# Patient Record
Sex: Male | Born: 1965 | Race: Black or African American | Hispanic: No | Marital: Married | Smoking: Never smoker
Health system: Southern US, Community
[De-identification: ages and names within clinical notes are randomized; demographics above are authoritative.]

## PROBLEM LIST (undated history)

## (undated) DIAGNOSIS — M1990 Unspecified osteoarthritis, unspecified site: Secondary | ICD-10-CM

## (undated) HISTORY — PX: WRIST SURGERY: SHX841

---

## 2000-05-27 ENCOUNTER — Emergency Department (HOSPITAL_COMMUNITY): Admission: EM | Admit: 2000-05-27 | Discharge: 2000-05-27 | Payer: Self-pay | Admitting: Emergency Medicine

## 2000-05-27 ENCOUNTER — Encounter: Payer: Self-pay | Admitting: Emergency Medicine

## 2004-07-06 ENCOUNTER — Emergency Department (HOSPITAL_COMMUNITY): Admission: EM | Admit: 2004-07-06 | Discharge: 2004-07-06 | Payer: Self-pay | Admitting: Emergency Medicine

## 2004-07-29 ENCOUNTER — Emergency Department (HOSPITAL_COMMUNITY): Admission: EM | Admit: 2004-07-29 | Discharge: 2004-07-29 | Payer: Self-pay | Admitting: Family Medicine

## 2005-05-16 ENCOUNTER — Emergency Department (HOSPITAL_COMMUNITY): Admission: EM | Admit: 2005-05-16 | Discharge: 2005-05-17 | Payer: Self-pay | Admitting: Emergency Medicine

## 2006-01-28 ENCOUNTER — Emergency Department (HOSPITAL_COMMUNITY): Admission: EM | Admit: 2006-01-28 | Discharge: 2006-01-28 | Payer: Self-pay | Admitting: Emergency Medicine

## 2007-04-19 ENCOUNTER — Emergency Department (HOSPITAL_COMMUNITY): Admission: EM | Admit: 2007-04-19 | Discharge: 2007-04-19 | Payer: Self-pay | Admitting: Emergency Medicine

## 2010-08-12 ENCOUNTER — Emergency Department (HOSPITAL_COMMUNITY)
Admission: EM | Admit: 2010-08-12 | Discharge: 2010-08-12 | Disposition: A | Discharge status: Home / Self Care | Payer: Self-pay | Attending: Emergency Medicine | Admitting: Emergency Medicine

## 2010-08-12 DIAGNOSIS — M25569 Pain in unspecified knee: Secondary | ICD-10-CM | POA: Insufficient documentation

## 2010-09-29 ENCOUNTER — Emergency Department (HOSPITAL_COMMUNITY)
Admission: EM | Admit: 2010-09-29 | Discharge: 2010-09-29 | Disposition: A | Discharge status: Home / Self Care | Payer: No Typology Code available for payment source | Attending: Emergency Medicine | Admitting: Emergency Medicine

## 2010-09-29 ENCOUNTER — Emergency Department (HOSPITAL_COMMUNITY): Payer: No Typology Code available for payment source

## 2010-09-29 DIAGNOSIS — M25569 Pain in unspecified knee: Secondary | ICD-10-CM | POA: Insufficient documentation

## 2010-09-29 DIAGNOSIS — M546 Pain in thoracic spine: Secondary | ICD-10-CM | POA: Insufficient documentation

## 2010-09-29 DIAGNOSIS — M545 Low back pain, unspecified: Secondary | ICD-10-CM | POA: Insufficient documentation

## 2012-07-23 ENCOUNTER — Encounter (HOSPITAL_COMMUNITY): Payer: Self-pay | Admitting: Emergency Medicine

## 2012-07-23 ENCOUNTER — Emergency Department (HOSPITAL_COMMUNITY): Payer: Self-pay

## 2012-07-23 ENCOUNTER — Emergency Department (HOSPITAL_COMMUNITY)
Admission: EM | Admit: 2012-07-23 | Discharge: 2012-07-23 | Disposition: A | Discharge status: Home / Self Care | Payer: Self-pay | Attending: Emergency Medicine | Admitting: Emergency Medicine

## 2012-07-23 DIAGNOSIS — M1710 Unilateral primary osteoarthritis, unspecified knee: Secondary | ICD-10-CM | POA: Insufficient documentation

## 2012-07-23 DIAGNOSIS — M1990 Unspecified osteoarthritis, unspecified site: Secondary | ICD-10-CM

## 2012-07-23 DIAGNOSIS — G8929 Other chronic pain: Secondary | ICD-10-CM | POA: Insufficient documentation

## 2012-07-23 DIAGNOSIS — IMO0002 Reserved for concepts with insufficient information to code with codable children: Secondary | ICD-10-CM | POA: Insufficient documentation

## 2012-07-23 MED ORDER — MELOXICAM 15 MG PO TABS: 15.0000 mg | ORAL_TABLET | Freq: Every day | ORAL | Status: DC

## 2012-07-23 MED ORDER — OXYCODONE-ACETAMINOPHEN 5-325 MG PO TABS: ORAL_TABLET | ORAL | Status: DC

## 2012-07-23 NOTE — ED Provider Notes (Signed)
Medical screening examination/treatment/procedure(s) were performed by non-physician practitioner and as supervising physician I was immediately available for consultation/collaboration.  Ethelda Chick, MD 07/23/12 [phone]

## 2012-07-23 NOTE — ED Notes (Signed)
Pt from home c/o bilateral knee pain. Pt reports that he has been having knee pain for approx 2 mths. Pt denies injury bur states " they are swollen". Pt A&O and in NAD

## 2012-07-23 NOTE — ED Provider Notes (Signed)
History    CSN: [id number] Arrival date & time 07/23/12  1448  First MD Initiated Contact with Patient 07/23/12 1511     Chief Complaint  Patient presents with  . Knee Pain   (Consider location/radiation/quality/duration/timing/severity/associated sxs/prior Treatment) HPI Comments: Patient with knee pain BL. Onset several months ago. Morning stiffness. He says his knees burn, click and pop. He has no history of injuries but played basketball for many years. Associated heat and swelling. Denies fevers, chills, myalgias, . Denies DOE, SOB, chest tightness or pressure, radiation to left arm, jaw or back, or diaphoresis. Denies dysuria, flank pain, suprapubic pain, frequency, urgency, or hematuria. Denies headaches, light headedness, weakness, visual disturbances. Denies abdominal pain, nausea, vomiting, diarrhea or constipation.    Patient is a 47 y.o. male presenting with knee pain. The history is provided by the patient. No language interpreter was used.  Knee Pain Location:  Knee Time since incident: several months. Injury: no   Knee location:  L knee and R knee Pain details:    Quality:  Aching and burning   Radiates to:  Does not radiate   Pain severity now: moderate to severe.   Onset quality:  Gradual   Timing:  Constant Chronicity:  Chronic Dislocation: no   Relieved by:  None tried Worsened by:  Activity and bearing weight Associated symptoms: decreased ROM   Associated symptoms: no back pain, no fatigue, no fever, no itching, no muscle weakness, no neck pain, no numbness, no stiffness, no swelling and no tingling   Risk factors: obesity   Risk factors: no concern for non-accidental trauma, no frequent fractures, no known bone disorder and no recent illness      History reviewed. No pertinent past medical history. Past Surgical History  Procedure Laterality Date  . Wrist surgery     No family history on file. History  Substance Use Topics  . Smoking status:  Never Smoker   . Smokeless tobacco: Not on file  . Alcohol Use: No    Review of Systems  Constitutional: Negative for fever and fatigue.  HENT: Negative for neck pain.   Eyes: Negative for pain.  Respiratory: Negative for shortness of breath.   Cardiovascular: Negative for chest pain and palpitations.  Genitourinary: Negative for discharge, penile swelling and penile pain.  Musculoskeletal: Positive for arthralgias and gait problem. Negative for back pain and stiffness.  Skin: Negative for itching, rash and wound.    Allergies  Review of patient's allergies indicates no known allergies.  Home Medications  No current outpatient prescriptions on file. BP 118/70  Pulse 74  Temp(Src) 98.3 F (36.8 C) (Oral)  Resp 16  SpO2 99% Physical Exam Physical Exam  Nursing note and vitals reviewed. Constitutional: He appears well-developed and well-nourished. No distress.  HENT:  Head: Normocephalic and atraumatic.  Eyes: Conjunctivae normal are normal. No scleral icterus.  Neck: Normal range of motion. Neck supple.  Cardiovascular: Normal rate, regular rhythm and normal heart sounds.   Pulmonary/Chest: Effort normal and breath sounds normal. No respiratory distress.  Abdominal: Soft. There is no tenderness.  Musculoskeletal: He exhibits no edema. BL crepitus of the knees with movement. Warm to the touch. No heat redness or swelling. Good stability in the joint. N/V intact Neurological: He is alert.  Skin: Skin is warm and dry. He is not diaphoretic.  Psychiatric: His behavior is normal.    ED Course  Procedures (including critical care time) Labs Reviewed - No data to display Dg Knee Complete  4 Views Left  07/23/2012   *RADIOLOGY REPORT*  Clinical Data:  knee pain  LEFT KNEE - COMPLETE 4+ VIEW  Comparison: None.  Findings: There is no joint effusion.  No fracture or subluxation identified.  Sharpening tibial spines and marginal spur formation is noted consistent with osteoarthritis.   IMPRESSION:  1.  No acute findings. 2.  Osteoarthritis.   Original Report Authenticated By: Signa Kell, M.D.   Dg Knee Complete 4 Views Right  07/23/2012   *RADIOLOGY REPORT*  Clinical Data: Knee pain  RIGHT KNEE - COMPLETE 4+ VIEW  Comparison: None  Findings: No joint effusion.  There is no fracture or subluxation identified.  There is no radiopaque foreign body or soft tissue calcification.  Sharpening tibial spines and marginal spur formation is noted consistent with osteoarthritis.  IMPRESSION:  1.  Degenerative joint disease. 2.  No acute findings.   Original Report Authenticated By: Signa Kell, M.D.   1. Osteoarthritis (arthritis due to wear and tear of joints)   2. Degenerative joint disease     MDM  4:00 PM Patient's xrays shows OA of the knees BL.  Will D/C with Mobic  And percocet for severe pain. Do not suspect autoimmune process.     Arthor Captain, PA-C 07/23/12 1601

## 2013-07-30 ENCOUNTER — Emergency Department (HOSPITAL_COMMUNITY): Payer: Self-pay

## 2013-07-30 ENCOUNTER — Emergency Department (HOSPITAL_COMMUNITY)
Admission: EM | Admit: 2013-07-30 | Discharge: 2013-07-30 | Disposition: A | Discharge status: Home / Self Care | Payer: Self-pay | Attending: Emergency Medicine | Admitting: Emergency Medicine

## 2013-07-30 ENCOUNTER — Encounter (HOSPITAL_COMMUNITY): Payer: Self-pay | Admitting: Emergency Medicine

## 2013-07-30 DIAGNOSIS — S8990XA Unspecified injury of unspecified lower leg, initial encounter: Secondary | ICD-10-CM | POA: Insufficient documentation

## 2013-07-30 DIAGNOSIS — X500XXA Overexertion from strenuous movement or load, initial encounter: Secondary | ICD-10-CM | POA: Insufficient documentation

## 2013-07-30 DIAGNOSIS — S99929A Unspecified injury of unspecified foot, initial encounter: Principal | ICD-10-CM

## 2013-07-30 DIAGNOSIS — Y9301 Activity, walking, marching and hiking: Secondary | ICD-10-CM | POA: Insufficient documentation

## 2013-07-30 DIAGNOSIS — Y9289 Other specified places as the place of occurrence of the external cause: Secondary | ICD-10-CM | POA: Insufficient documentation

## 2013-07-30 DIAGNOSIS — Z791 Long term (current) use of non-steroidal anti-inflammatories (NSAID): Secondary | ICD-10-CM | POA: Insufficient documentation

## 2013-07-30 DIAGNOSIS — S99919A Unspecified injury of unspecified ankle, initial encounter: Principal | ICD-10-CM

## 2013-07-30 DIAGNOSIS — M25561 Pain in right knee: Secondary | ICD-10-CM

## 2013-07-30 MED ORDER — HYDROCODONE-ACETAMINOPHEN 5-325 MG PO TABS: 1.0000 | ORAL_TABLET | Freq: Four times a day (QID) | ORAL | Status: DC | PRN

## 2013-07-30 MED ORDER — NAPROXEN 500 MG PO TABS: 500.0000 mg | ORAL_TABLET | Freq: Two times a day (BID) | ORAL | Status: DC

## 2013-07-30 NOTE — Discharge Instructions (Signed)
Be sure to read and understand instructions below prior to leaving the hospital. If your symptoms persist without any improvement in 1 week it is recommended that you follow up with orthopedics listed above. Use your pain medication as prescribed and do not operate heavy machinery while on pain medication. Note that your pain medication contains acetaminophen (Tylenol) & its is not reccommended that you use additional acetaminophen (Tylenol) while taking this medication.  Knee Effusion  The medical term for having fluid in your knee is effusion.This means something is wrong inside the knee. Some of the causes of fluid in the knee may be torn cartilage, a torn ligament, or bleeding into the joint from an injury. Small tears may heal on their own with conservative treatment. Conservative means rest, limited weight bearing activity and muscle strengthening exercises. Your recovery may take up to 6 weeks. Larger tears may require surgery.   TREATMENT  Rest, ice, elevation, and compression are the basic modes of treatment.   Apply ice to the sore area for 15 to 20 minutes, 3 to 4 times per day. Do this while you are awake for the first 2 days, or as directed. This can be stopped when the swelling goes away. Put the ice in a plastic bag and place a towel between the bag of ice and your skin.  Keep your leg elevated when possible to lessen swelling.  If your caregiver recommends crutches, use them as instructed for 1 week. Then, you may walk as tolerated.  Do not drive a vehicle on pain medication. ACTIVITY:            - Weight bearing as tolerated            - Exercises should be limited to pain free range of motion  Knee Immobilization:: This is used to support and protect an injured or painful knee. Knee immobilizers keep your knee from being used while it is healing.  Use powder to control irritation from sweat and friction.  Adjust the immobilizer to be firm but not tight. Signs of an immobilizer that  is too tight include:   Swelling.   Numbness.   Color change in your foot or ankle.   Increased pain.  While resting, raise your leg above the level of your heart. This reduces throbbing and helps healing. Prop it up with pillows.  Remove the immobilizer to bathe and sleep. Wear it other times until you see your doctor again.               SEEK MEDICAL CARE IF:  You have an increase in bruising, swelling, or pain.  Your toes feel cold.  Pain relief is not achieved with medications.  EMERGENCY:: Your toes are numb or blue or you have severe pain.  You notice redness, swelling, warmth or increasing pain in your knee.  An unexplained oral temperature above 102 F (38.9 C) develops.  COLD THERAPY DIRECTIONS:  Ice or gel packs can be used to reduce both pain and swelling. Ice is the most helpful within the first 24 to 48 hours after an injury or flareup from overusing a muscle or joint.  Ice is effective, has very few side effects, and is safe for most people to use.   If you expose your skin to cold temperatures for too long or without the proper protection, you can damage your skin or nerves. Watch for signs of skin damage due to cold.   HOME CARE INSTRUCTIONS  Follow these  tips to use ice and cold packs safely.  °Place a dry or damp towel between the ice and skin. A damp towel will cool the skin more quickly, so you may need to shorten the time that the ice is used.  °For a more rapid response, add gentle compression to the ice.  °Ice for no more than 10 to 20 minutes at a time. The bonier the area you are icing, the less time it will take to get the benefits of ice.  °Check your skin after 5 minutes to make sure there are no signs of a poor response to cold or skin damage.  °Rest 20 minutes or more in between uses.  °Once your skin is numb, you can end your treatment. You can test numbness by very lightly touching your skin. The touch should be so light that you do not see the skin dimple from  the pressure of your fingertip. When using ice, most people will feel these normal sensations in this order: cold, burning, aching, and numbness.  °Do not use ice on someone who cannot communicate their responses to pain, such as small children or people with dementia.  ° °HOW TO MAKE AN ICE PACK  °To make an ice pack, do one of the following:  °Place crushed ice or a bag of frozen vegetables in a sealable plastic bag. Squeeze out the excess air. Place this bag inside another plastic bag. Slide the bag into a pillowcase or place a damp towel between your skin and the bag.  °Mix 3 parts water with 1 part rubbing alcohol. Freeze the mixture in a sealable plastic bag. When you remove the mixture from the freezer, it will be slushy. Squeeze out the excess air. Place this bag inside another plastic bag. Slide the bag into a pillowcase or place a damp towel between your s ° ° ° ° ° °

## 2013-07-30 NOTE — ED Provider Notes (Signed)
CSN: [id number]     Arrival date & time 07/30/13  1521 History   None   This chart was scribed for non-physician practitioner working with Gilda Crease, *, by Andrew Au, ED Scribe. This patient was seen in room WTR5/WTR5 and the patient's care was started at 4:59 PM. No chief complaint on file.  The history is provided by the patient. No language interpreter was used.   Nicholas Howell is a 48 y.o. male who presents to the Emergency Department complaining of shooting right knee pain x 1 day. Pt states he was walking and stepped in a hole causing his knee to give out. He reports he heard a "pop" in right knee. He reports unchanged instability and pain with walking today. Pt states he has taken Advil without relief to pain. He reports associated swelling of the knee, which has improved. Pt denies numbness and tingling.  No prior knee injury.    History reviewed. No pertinent past medical history. Past Surgical History  Procedure Laterality Date  . Wrist surgery     No family history on file. History  Substance Use Topics  . Smoking status: Never Smoker   . Smokeless tobacco: Not on file  . Alcohol Use: No    Review of Systems  Constitutional: Negative for fever and chills.  Musculoskeletal: Positive for arthralgias, gait problem and myalgias.  Neurological: Negative for weakness and numbness.    Allergies  Review of patient's allergies indicates no known allergies.  Home Medications   Prior to Admission medications   Medication Sig Start Date End Date Taking? Authorizing Provider  meloxicam (MOBIC) 15 MG tablet Take 1 tablet (15 mg total) by mouth daily. Take 1 daily with food. 07/23/12   Arthor Captain, PA-C  oxyCODONE-acetaminophen (PERCOCET) 5-325 MG per tablet 1 tablet every 6 hours as needed for your worst pain. 07/23/12   Abigail Harris, PA-C   BP 116/69  Pulse 82  Temp(Src) 98.4 F (36.9 C) (Oral)  Resp 18  SpO2 97% Physical Exam  Nursing note and vitals  reviewed. Constitutional: He is oriented to person, place, and time. He appears well-developed and well-nourished. No distress.  HENT:  Head: Normocephalic and atraumatic.  Eyes: Conjunctivae and EOM are normal.  Neck: Neck supple.  Cardiovascular: Normal rate, regular rhythm and normal heart sounds.   Pulses:      Dorsalis pedis pulses are 2+ on the right side, and 2+ on the left side.  Pulmonary/Chest: Effort normal and breath sounds normal.  Musculoskeletal: Normal range of motion.       Right knee: He exhibits normal range of motion, no swelling and no erythema. Tenderness found. Medial joint line tenderness noted.  Right knee-  No obvious erythema, edema or warmth of the right knee. Pain with valgus stress of the right knee but no obvious laxity with valgus stress and varus stress of the right knee.  Neurological: He is alert and oriented to person, place, and time.  Distal sensation of right toes intact  Skin: Skin is warm and dry.  Psychiatric: He has a normal mood and affect. His behavior is normal.    ED Course  Procedures  DIAGNOSTIC STUDIES: Oxygen Saturation is 97% on RA, normal by my interpretation.    COORDINATION OF CARE: 5:13 PM- Pt advised of plan for treatment which includes knee immobilizer and pt agrees.  Labs Review Labs Reviewed - No data to display  Imaging Review Dg Knee Complete 4 Views Right  07/30/2013  CLINICAL DATA:  Trauma.  EXAM: RIGHT KNEE - COMPLETE 4+ VIEW  COMPARISON:  07/23/2012.  FINDINGS: Prominent tricompartment degenerative change noted. Degenerative changes are particularly severe at the patellofemoral joint space. No evidence of fracture or dislocation. No evidence of effusion .Similar findings noted on prior study.  IMPRESSION: Severe degenerative changes right knee, particularly prominent about the patellofemoral joint space. No acute abnormality. Similar findings noted on prior study .   Electronically Signed   By: Maisie Fushomas  Register   On:  07/30/2013 16:11     EKG Interpretation None      MDM   Final diagnoses:  None  Patient presenting with knee injury.  No acute findings on xray.  No signs of infection of the knee.  Neurovascularly intact.  Patient declined knee immobilizer so he was given knee sleeve.  Patient given short course of pain medication and referral to Orthopedics.    I personally performed the services described in this documentation, which was scribed in my presence. The recorded information has been reviewed and is accurate.      Santiago GladHeather Katelynd Blauvelt, PA-C 08/01/13 1035

## 2013-07-30 NOTE — ED Notes (Signed)
Pt reports right knee "gave out" on him Sunday morning and today he had difficulty ambulating pain 9/10 with swelling.

## 2013-08-03 NOTE — ED Provider Notes (Signed)
Medical screening examination/treatment/procedure(s) were performed by non-physician practitioner and as supervising physician I was immediately available for consultation/collaboration.   EKG Interpretation None        Christopher J. Pollina, MD 08/03/13 1501 

## 2013-11-01 ENCOUNTER — Emergency Department (HOSPITAL_COMMUNITY)
Admission: EM | Admit: 2013-11-01 | Discharge: 2013-11-01 | Disposition: A | Discharge status: Home / Self Care | Payer: Self-pay | Attending: Emergency Medicine | Admitting: Emergency Medicine

## 2013-11-01 ENCOUNTER — Emergency Department (HOSPITAL_COMMUNITY): Payer: Self-pay

## 2013-11-01 ENCOUNTER — Encounter (HOSPITAL_COMMUNITY): Payer: Self-pay | Admitting: Emergency Medicine

## 2013-11-01 DIAGNOSIS — M179 Osteoarthritis of knee, unspecified: Secondary | ICD-10-CM | POA: Insufficient documentation

## 2013-11-01 DIAGNOSIS — X58XXXA Exposure to other specified factors, initial encounter: Secondary | ICD-10-CM | POA: Insufficient documentation

## 2013-11-01 DIAGNOSIS — Y9289 Other specified places as the place of occurrence of the external cause: Secondary | ICD-10-CM | POA: Insufficient documentation

## 2013-11-01 DIAGNOSIS — Z87828 Personal history of other (healed) physical injury and trauma: Secondary | ICD-10-CM | POA: Insufficient documentation

## 2013-11-01 DIAGNOSIS — M1711 Unilateral primary osteoarthritis, right knee: Secondary | ICD-10-CM

## 2013-11-01 DIAGNOSIS — S8991XA Unspecified injury of right lower leg, initial encounter: Secondary | ICD-10-CM | POA: Insufficient documentation

## 2013-11-01 DIAGNOSIS — Y9389 Activity, other specified: Secondary | ICD-10-CM | POA: Insufficient documentation

## 2013-11-01 MED ORDER — OXYCODONE-ACETAMINOPHEN 5-325 MG PO TABS
2.0000 | ORAL_TABLET | Freq: Once | ORAL | Status: AC
  Administered 2013-11-01: 2 via ORAL
  Filled 2013-11-01: qty 2

## 2013-11-01 MED ORDER — IBUPROFEN 800 MG PO TABS
800.0000 mg | ORAL_TABLET | Freq: Once | ORAL | Status: AC
  Administered 2013-11-01: 800 mg via ORAL
  Filled 2013-11-01: qty 1

## 2013-11-01 NOTE — ED Provider Notes (Signed)
Medical screening examination/treatment/procedure(s) were performed by non-physician practitioner and as supervising physician I was immediately available for consultation/collaboration.   EKG Interpretation None        Gilda Creasehristopher J. Pollina, MD 11/01/13 [phone]

## 2013-11-01 NOTE — ED Notes (Signed)
Patient transported to X-ray via stretcher Patient in NAD upon leaving for testing 

## 2013-11-01 NOTE — ED Notes (Signed)
Patient back from radiology Patient remains in NAD 

## 2013-11-01 NOTE — ED Provider Notes (Signed)
CSN: [id number]     Arrival date & time 11/01/13  [id number] History   First MD Initiated Contact with Patient 11/01/13 [phone]     Chief Complaint  Patient presents with  . Knee Pain     (Consider location/radiation/quality/duration/timing/severity/associated sxs/prior Treatment) HPI Nicholas Howell is a 48 y.o. male with PMH of right knee injury and pain presenting with right knee pain after a fall 1-2 hours ago. Patient felt a pop and and his knee gave way. He fell but caught himself. No direct injury to the knee. Patient did not take anything for pain and immediately came here. Pt ambulatory. Pain is sharp, bilaterally below the patella. No swelling. Patient without fevers, chills, nausea, vomiting. Patient with history of playing basketball. Most recent fall July of this year. Xray with significant degenerative changes to knee, especially at patella femoral joint. Patient referred to orthopedics but has not made appointment due to busy schedule.   History reviewed. No pertinent past medical history. Past Surgical History  Procedure Laterality Date  . Wrist surgery     No family history on file. History  Substance Use Topics  . Smoking status: Never Smoker   . Smokeless tobacco: Not on file  . Alcohol Use: No    Review of Systems  Musculoskeletal: Negative for joint swelling.  Skin: Negative for wound.  Neurological: Negative for weakness and numbness.      Allergies  Review of patient's allergies indicates no known allergies.  Home Medications   Prior to Admission medications   Not on File   BP 110/69  Pulse 66  Temp(Src) 98.1 F (36.7 C) (Oral)  Resp 18  Ht 6\' 1"  (1.854 m)  Wt 230 lb (104.327 kg)  BMI 30.35 kg/m2  SpO2 100% Physical Exam  Nursing note and vitals reviewed. Constitutional: He appears well-developed and well-nourished.  HENT:  Head: Normocephalic and atraumatic.  Eyes: Conjunctivae are normal. Right eye exhibits no discharge. Left eye exhibits no  discharge.  Pulmonary/Chest: Effort normal.  Musculoskeletal:  Right knee without effusion, erythema, superficial swelling, or warmth. Right medial joint line tenderness. FROM with mild pain. No marked laxity of right knee compared to left. Pain with valgus stress. Antalgic gait. Pedal pulses intact.  Neurological: He is alert. He exhibits normal muscle tone.  Skin: Skin is warm and dry.  Psychiatric: He has a normal mood and affect. His behavior is normal.    ED Course  Procedures (including critical care time) Labs Review Labs Reviewed - No data to display  Imaging Review Dg Knee Complete 4 Views Right  11/01/2013   CLINICAL DATA:  The patient reports his right knee gave way this morning resulting in a fall. Right knee pain.  EXAM: RIGHT KNEE - COMPLETE 4+ VIEW  COMPARISON:  Plain films right knee 07/30/2013.  FINDINGS: No acute bony or joint abnormality is identified. Tricompartmental degenerative disease is again seen. There is no joint effusion.  IMPRESSION: No acute finding.  Tricompartmental osteoarthritis which appears advanced for age.   Electronically Signed   By: Drusilla Kannerhomas  Dalessio M.D.   On: 11/01/2013 07:47     EKG Interpretation None      MDM   Final diagnoses:  Osteoarthritis of right knee, unspecified osteoarthritis type   Patient with chronic right knee pain, played basketball in youth, prior fall due to knee pain with degenerative disease on xray. Patient with new fall due to knee giving way and reports a pop. No new changes on xray. Knee neurovascularly intact  without erythema, effusion, warmth or effusion or fevers. I doubt septic arthritis. Patient with midline joint tenderness and pain with valgus stress. This may represent OA flare or meniscal injury. Patient encouraged to follow up with orthopedist for further evaluation. Info provided. Stressed the chronic nature of OA and the importance of seeing orthopedist. tx with RICE protocol. Patient given knee sleeve in  past without improvement of symptoms. Patient with ace bandage at home and may use this for comfort. Patient given pain meds in ed with improvement of pain but no script due to chronic nature of pain and OA.   Discussed return precautions with patient. Discussed all results and patient verbalizes understanding and agrees with plan.     Manreet Kiernan L Louann Sjogrenreech, PA-C 11/01/13 [phone]

## 2013-11-01 NOTE — Discharge Instructions (Signed)
Return to the emergency room with worsening of symptoms, new symptoms or with symptoms that are concerning, especially fevers, warmth, erythema, swelling or severe pain, unable to move knee. RICE: Rest, Ice (three cycles of 20 mins on, 20mins off at least twice a day), compression/brace, elevation. Heating pad works well for back pain. Since the knee sleeve didn't work for you you may use an ace bandage for compression. Ibuprofen 400mg  (2 tablets 200mg ) every 5-6 hours for 3-5 days and then as needed for pain. Follow up with orthopedist. Call to make appointment, number above.   Arthritis, Nonspecific Arthritis is inflammation of a joint. This usually means pain, redness, warmth or swelling are present. One or more joints may be involved. There are a number of types of arthritis. Your caregiver may not be able to tell what type of arthritis you have right away. CAUSES  The most common cause of arthritis is the wear and tear on the joint (osteoarthritis). This causes damage to the cartilage, which can break down over time. The knees, hips, back and neck are most often affected by this type of arthritis. Other types of arthritis and common causes of joint pain include:  Sprains and other injuries near the joint. Sometimes minor sprains and injuries cause pain and swelling that develop hours later.  Rheumatoid arthritis. This affects hands, feet and knees. It usually affects both sides of your body at the same time. It is often associated with chronic ailments, fever, weight loss and general weakness.  Crystal arthritis. Gout and pseudo gout can cause occasional acute severe pain, redness and swelling in the foot, ankle, or knee.  Infectious arthritis. Bacteria can get into a joint through a break in overlying skin. This can cause infection of the joint. Bacteria and viruses can also spread through the blood and affect your joints.  Drug, infectious and allergy reactions. Sometimes joints can become  mildly painful and slightly swollen with these types of illnesses. SYMPTOMS   Pain is the main symptom.  Your joint or joints can also be red, swollen and warm or hot to the touch.  You may have a fever with certain types of arthritis, or even feel overall ill.  The joint with arthritis will hurt with movement. Stiffness is present with some types of arthritis. DIAGNOSIS  Your caregiver will suspect arthritis based on your description of your symptoms and on your exam. Testing may be needed to find the type of arthritis:  Blood and sometimes urine tests.  X-ray tests and sometimes CT or MRI scans.  Removal of fluid from the joint (arthrocentesis) is done to check for bacteria, crystals or other causes. Your caregiver (or a specialist) will numb the area over the joint with a local anesthetic, and use a needle to remove joint fluid for examination. This procedure is only minimally uncomfortable.  Even with these tests, your caregiver may not be able to tell what kind of arthritis you have. Consultation with a specialist (rheumatologist) may be helpful. TREATMENT  Your caregiver will discuss with you treatment specific to your type of arthritis. If the specific type cannot be determined, then the following general recommendations may apply. Treatment of severe joint pain includes:  Rest.  Elevation.  Anti-inflammatory medication (for example, ibuprofen) may be prescribed. Avoiding activities that cause increased pain.  Only take over-the-counter or prescription medicines for pain and discomfort as recommended by your caregiver.  Cold packs over an inflamed joint may be used for 10 to 15 minutes every  hour. Hot packs sometimes feel better, but do not use overnight. Do not use hot packs if you are diabetic without your caregiver's permission.  A cortisone shot into arthritic joints may help reduce pain and swelling.  Any acute arthritis that gets worse over the next 1 to 2 days needs  to be looked at to be sure there is no joint infection. Long-term arthritis treatment involves modifying activities and lifestyle to reduce joint stress jarring. This can include weight loss. Also, exercise is needed to nourish the joint cartilage and remove waste. This helps keep the muscles around the joint strong. HOME CARE INSTRUCTIONS   Do not take aspirin to relieve pain if gout is suspected. This elevates uric acid levels.  Only take over-the-counter or prescription medicines for pain, discomfort or fever as directed by your caregiver.  Rest the joint as much as possible.  If your joint is swollen, keep it elevated.  Use crutches if the painful joint is in your leg.  Drinking plenty of fluids may help for certain types of arthritis.  Follow your caregiver's dietary instructions.  Try low-impact exercise such as:  Swimming.  Water aerobics.  Biking.  Walking.  Morning stiffness is often relieved by a warm shower.  Put your joints through regular range-of-motion. SEEK MEDICAL CARE IF:   You do not feel better in 24 hours or are getting worse.  You have side effects to medications, or are not getting better with treatment. SEEK IMMEDIATE MEDICAL CARE IF:   You have a fever.  You develop severe joint pain, swelling or redness.  Many joints are involved and become painful and swollen.  There is severe back pain and/or leg weakness.  You have loss of bowel or bladder control. Document Released: 02/12/2004 Document Revised: 03/29/2011 Document Reviewed: 02/28/2008 [facility] Patient Information 2015 [place], [place]. This information is not intended to replace advice given to you by your health care provider. Make sure you discuss any questions you have with your health care provider.

## 2013-11-01 NOTE — ED Notes (Signed)
Pt states that he fell down the stairs this am and c/o right knee pain

## 2013-11-07 ENCOUNTER — Encounter (HOSPITAL_COMMUNITY): Payer: Self-pay | Admitting: Emergency Medicine

## 2013-11-07 ENCOUNTER — Emergency Department (HOSPITAL_COMMUNITY)
Admission: EM | Admit: 2013-11-07 | Discharge: 2013-11-07 | Disposition: A | Discharge status: Home / Self Care | Payer: No Typology Code available for payment source | Attending: Emergency Medicine | Admitting: Emergency Medicine

## 2013-11-07 DIAGNOSIS — M25561 Pain in right knee: Secondary | ICD-10-CM | POA: Insufficient documentation

## 2013-11-07 DIAGNOSIS — G8929 Other chronic pain: Secondary | ICD-10-CM | POA: Insufficient documentation

## 2013-11-07 MED ORDER — MELOXICAM 7.5 MG PO TABS: ORAL_TABLET | ORAL | Status: DC

## 2013-11-07 MED ORDER — HYDROCODONE-ACETAMINOPHEN 5-325 MG PO TABS
2.0000 | ORAL_TABLET | Freq: Once | ORAL | Status: AC
  Administered 2013-11-07: 2 via ORAL
  Filled 2013-11-07: qty 2

## 2013-11-07 NOTE — Discharge Instructions (Signed)
Arthritis, Nonspecific °Arthritis is pain, redness, warmth, or puffiness (inflammation) of a joint. The joint may be stiff or hurt when you move it. One or more joints may be affected. There are many types of arthritis. Your doctor may not know what type you have right away. The most common cause of arthritis is wear and tear on the joint (osteoarthritis). °HOME CARE  °· Only take medicine as told by your doctor. °· Rest the joint as much as possible. °· Raise (elevate) your joint if it is puffy. °· Use crutches if the painful joint is in your leg. °· Drink enough fluids to keep your pee (urine) clear or pale yellow. °· Follow your doctor's diet instructions. °· Use cold packs for very bad joint pain for 10 to 15 minutes every hour. Ask your doctor if it is okay for you to use hot packs. °· Exercise as told by your doctor. °· Take a warm shower if you have stiffness in the morning. °· Move your sore joints throughout the day. °GET HELP RIGHT AWAY IF:  °· You have a fever. °· You have very bad joint pain, puffiness, or redness. °· You have many joints that are painful and puffy. °· You are not getting better with treatment. °· You have very bad back pain or leg weakness. °· You cannot control when you poop (bowel movement) or pee (urinate). °· You do not feel better in 24 hours or are getting worse. °· You are having side effects from your medicine. °MAKE SURE YOU:  °· Understand these instructions. °· Will watch your condition. °· Will get help right away if you are not doing well or get worse. °Document Released: 03/31/2009 Document Revised: 07/06/2011 Document Reviewed: 03/31/2009 °[employer]® Patient Information ©2015 [employer]. This information is not intended to replace advice given to you by your health care provider. Make sure you discuss any questions you have with your health care provider. ° °

## 2013-11-07 NOTE — ED Notes (Addendum)
Pt directed not to drive or operate heavy machinery while taking pain medication.  Pt verbalized understanding.

## 2013-11-07 NOTE — ED Notes (Signed)
Pt c/o rt knee pain x a year.

## 2013-11-07 NOTE — ED Provider Notes (Signed)
CSN: [id number]     Arrival date & time 11/07/13  1057 History   First MD Initiated Contact with Patient 11/07/13 1119     Chief Complaint  Patient presents with  . Knee Pain     (Consider location/radiation/quality/duration/timing/severity/associated sxs/prior Treatment) HPI Pt is a 48yo male with hx of chronic knee pain presenting to ED with c/o right knee pain. States he woke up this morning, right knee gave out causing increased pain in his right knee. Pain is aching and sharp, constant,  10/10, worse with knee flexion.  States he cannot go to work today due to the pain. Reports he has a f/u with ortho "in a few weeks" but does not know name of physician or practice. No knew injuries.  No medication tried PTA. States he has tried knee sleeve in past but it "didn't do anything, did not feel tight."  History reviewed. No pertinent past medical history. Past Surgical History  Procedure Laterality Date  . Wrist surgery     No family history on file. History  Substance Use Topics  . Smoking status: Never Smoker   . Smokeless tobacco: Not on file  . Alcohol Use: No    Review of Systems  Constitutional: Negative for fever and chills.  Musculoskeletal: Positive for arthralgias ( right knee). Negative for joint swelling.  Skin: Negative for color change, rash and wound.  All other systems reviewed and are negative.    Allergies  Review of patient's allergies indicates no known allergies.  Home Medications   Prior to Admission medications   Medication Sig Start Date End Date Taking? Authorizing Provider  meloxicam (MOBIC) 7.5 MG tablet Take 1-2 tabs daily as needed for pain to help with inflammation. 11/07/13   Junius FinnerErin O'Malley, PA-C   BP 120/65  Pulse 69  Temp(Src) 98.2 F (36.8 C) (Oral)  Resp 20  SpO2 98% Physical Exam  Nursing note and vitals reviewed. Constitutional: He is oriented to person, place, and time. He appears well-developed and well-nourished.  HENT:   Head: Normocephalic and atraumatic.  Eyes: EOM are normal.  Neck: Normal range of motion.  Cardiovascular: Normal rate.   Pulses:      Dorsalis pedis pulses are 2+ on the right side.  Pulmonary/Chest: Effort normal.  Musculoskeletal: Normal range of motion. He exhibits tenderness. He exhibits no edema.  Right knee: no deformity or edema, mild crepitus with full knee extension and flexion.  5/5 strength bilateral legs. Antalgic gait.  Neurological: He is alert and oriented to person, place, and time.  Sensation in tact right lower leg.  Skin: Skin is warm and dry. No erythema.  Skin in tact. No ecchymosis, erythema, or warmth. No red streaking, induration, or evidence of underlying infection  Psychiatric: He has a normal mood and affect. His behavior is normal.    ED Course  Procedures (including critical care time) Labs Review Labs Reviewed - No data to display  Imaging Review No results found.   EKG Interpretation None      MDM   Final diagnoses:  Chronic knee pain, right    Pt is a 48yo male presenting to ED with c/o exacerbation of his chronic right knee pain.  States his right knee "gave out" this morning. On exam, no edema, or deformity. Right leg is neurovascularly in tact.  No new injuries. Previous medical records and imaging reviewed.  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  Strongly encouraged pt to f/u with Orthopedics. Provided  pt info for Dr. Sherlean FootLucey, orthopedics who is on-call today. Pain medication given in ED, mobic prescribed for home use.  Pt verbalized understanding and agreement with tx plan.     Junius Finnerrin O'Malley, PA-C 11/07/13 1209

## 2013-11-12 NOTE — ED Provider Notes (Signed)
Medical screening examination/treatment/procedure(s) were performed by non-physician practitioner and as supervising physician I was immediately available for consultation/collaboration.   EKG Interpretation None       Arby BarretteMarcy Girolamo Lortie, MD 11/12/13 [phone]

## 2014-08-13 ENCOUNTER — Encounter (HOSPITAL_COMMUNITY): Payer: Self-pay | Admitting: Emergency Medicine

## 2014-08-13 ENCOUNTER — Emergency Department (HOSPITAL_COMMUNITY)
Admission: EM | Admit: 2014-08-13 | Discharge: 2014-08-13 | Disposition: A | Discharge status: Home / Self Care | Payer: Self-pay | Attending: Emergency Medicine | Admitting: Emergency Medicine

## 2014-08-13 DIAGNOSIS — M545 Low back pain: Secondary | ICD-10-CM | POA: Insufficient documentation

## 2014-08-13 DIAGNOSIS — M25511 Pain in right shoulder: Secondary | ICD-10-CM | POA: Insufficient documentation

## 2014-08-13 DIAGNOSIS — M546 Pain in thoracic spine: Secondary | ICD-10-CM | POA: Insufficient documentation

## 2014-08-13 MED ORDER — METHOCARBAMOL 500 MG PO TABS
500.0000 mg | ORAL_TABLET | Freq: Once | ORAL | Status: AC
  Administered 2014-08-13: 500 mg via ORAL
  Filled 2014-08-13: qty 1

## 2014-08-13 MED ORDER — METHOCARBAMOL 500 MG PO TABS: 500.0000 mg | ORAL_TABLET | Freq: Two times a day (BID) | ORAL | Status: DC | PRN

## 2014-08-13 MED ORDER — NAPROXEN 250 MG PO TABS: 250.0000 mg | ORAL_TABLET | Freq: Two times a day (BID) | ORAL | Status: DC

## 2014-08-13 MED ORDER — ACETAMINOPHEN 325 MG PO TABS
650.0000 mg | ORAL_TABLET | Freq: Once | ORAL | Status: AC
  Administered 2014-08-13: 650 mg via ORAL
  Filled 2014-08-13: qty 2

## 2014-08-13 NOTE — Discharge Instructions (Signed)

## 2014-08-13 NOTE — ED Provider Notes (Signed)
CSN: [id number]     Arrival date & time 08/13/14  1252 History  This chart was scribed for non-physician practitioner, Will Marijo File, PA-C, working with Azalia Bilis, MD, by Ronney Lion, ED Scribe. This patient was seen in room WTR6/WTR6 and the patient's care was started at 1:42 PM.    Chief Complaint  Patient presents with  . Back Pain   The history is provided by the patient. No language interpreter was used.    HPI Comments: Nicholas Howell is a 49 y.o. male who presents to the Emergency Department complaining of constant, 8/10, right sided back pain, worse with movement, that began when he woke up this morning. This is a new pain.  He denies any falls or injuries. Patient denies trying any medications or treatments PTA. He denies difficulty ambulating. He denies bowel or bladder incontinence, difficulty urinating, dysuria, hematuria, urinary frequency, urinary urgency, numbness, tingling, chest pain, cough, abdominal pain, fever, or chills. He denies a history of CA or IVDA. Patient is not driving today. Patient states he does not have a PCP.   History reviewed. No pertinent past medical history. Past Surgical History  Procedure Laterality Date  . Wrist surgery     History reviewed. No pertinent family history. History  Substance Use Topics  . Smoking status: Never Smoker   . Smokeless tobacco: Not on file  . Alcohol Use: No    Review of Systems  Constitutional: Negative for fever and chills.  Respiratory: Negative for cough and shortness of breath.   Cardiovascular: Negative for chest pain.  Gastrointestinal: Negative for vomiting, abdominal pain and diarrhea.  Genitourinary: Negative for dysuria, urgency, frequency, hematuria and difficulty urinating.  Musculoskeletal: Positive for back pain. Negative for gait problem, neck pain and neck stiffness.  Skin: Negative for rash and wound.  Neurological: Negative for weakness and numbness.    Allergies  Review of patient's allergies  indicates no known allergies.  Home Medications   Prior to Admission medications   Medication Sig Start Date End Date Taking? Authorizing Provider  meloxicam (MOBIC) 7.5 MG tablet Take 1-2 tabs daily as needed for pain to help with inflammation. Patient not taking: Reported on 08/13/2014 11/07/13   Junius Finner, PA-C  methocarbamol (ROBAXIN) 500 MG tablet Take 1 tablet (500 mg total) by mouth 2 (two) times daily as needed for muscle spasms. 08/13/14   Everlene Farrier, PA-C  naproxen (NAPROSYN) 250 MG tablet Take 1 tablet (250 mg total) by mouth 2 (two) times daily with a meal. 08/13/14   Everlene Farrier, PA-C   BP 118/70 mmHg  Pulse 81  Temp(Src) 97.8 F (36.6 C) (Oral)  Resp 16  SpO2 98% Physical Exam  Constitutional: He is oriented to person, place, and time. He appears well-developed and well-nourished. No distress.  Nontoxic appearing.  HENT:  Head: Normocephalic and atraumatic.  Eyes: Right eye exhibits no discharge. Left eye exhibits no discharge.  Neck: Neck supple. No JVD present.  Cardiovascular: Normal rate, regular rhythm, normal heart sounds and intact distal pulses.   Bilateral radial, posterior tibialis and dorsalis pedis pulses are intact.    Pulmonary/Chest: Effort normal and breath sounds normal. No respiratory distress.  Abdominal: Soft. There is no tenderness. There is no guarding.  abdomen is soft and nontender to palpation.  Musculoskeletal: Normal range of motion. He exhibits tenderness. He exhibits no edema.  No midline back tenderness. No midline neck tenderness. Mild tenderness along right trapezius and rhomboids. No back erythema, ecchymosis, or deformity. No calf edema  or tenderness. Bilateral PT pulses intact. Strength is 5 out of 5 in his bilateral upper and lower extremities. Good equal grip strengths bilaterally. Patient is able to ambulate without difficulty or assistance with normal gait.  Lymphadenopathy:    He has no cervical adenopathy.  Neurological:  He is alert and oriented to person, place, and time. He has normal reflexes. He displays normal reflexes. Coordination normal.  Good equal grip strengths. 5/5 strength in BUE and BLE. Bilateral patellar DTRs intact.  Skin: Skin is warm and dry. No rash noted. He is not diaphoretic. No erythema. No pallor.  Psychiatric: He has a normal mood and affect. His behavior is normal.  Nursing note and vitals reviewed.   ED Course  Procedures (including critical care time)  DIAGNOSTIC STUDIES: Oxygen Saturation is 98% on RA, normal by my interpretation.    COORDINATION OF CARE: 1:18 PM - Will administer muscle relaxant here today. Will also discharge home with Rx muscle relaxants and anti-inflammatory (Naprosyn), which I instructed to take with food. Pt made aware of the sedating side effects of muscle relaxants. Will also give referral to [facility] for f/u. Pt verbalized understand and agreed to plan. Pt also requests a work note for today, which I agreed to give.  MDM   Meds given in ED:  Medications  acetaminophen (TYLENOL) tablet 650 mg (650 mg Oral Given 08/13/14 1355)  methocarbamol (ROBAXIN) tablet 500 mg (500 mg Oral Given 08/13/14 1355)    New Prescriptions   METHOCARBAMOL (ROBAXIN) 500 MG TABLET    Take 1 tablet (500 mg total) by mouth 2 (two) times daily as needed for muscle spasms.   NAPROXEN (NAPROSYN) 250 MG TABLET    Take 1 tablet (250 mg total) by mouth 2 (two) times daily with a meal.    Final diagnoses:  Right-sided thoracic back pain    Patient with right upper back pain with tenderness along his trapezius and rhomboids on the right.  No neurological deficits and normal neuro exam.  Patient can walk but states is painful.  No loss of bowel or bladder control.  No concern for cauda equina.  No fever, night sweats, weight loss, h/o cancer, IVDU.  RICE protocol and pain medicine indicated and discussed with patient. I advised the patient to follow-up with their primary care  provider this week. I advised the patient to return to the emergency department with new or worsening symptoms or new concerns. The patient verbalized understanding and agreement with plan.     I personally performed the services described in this documentation, which was scribed in my presence. The recorded information has been reviewed and is accurate.        Everlene Farrier, PA-C 08/13/14 1356  Azalia Bilis, MD 08/13/14 [phone]

## 2014-08-13 NOTE — ED Notes (Signed)
Pt reports R sided back pain in thoracic and lumbar area. Woke up with the pain. Cannot think of any recent injuries or bending/straining.

## 2014-10-03 ENCOUNTER — Emergency Department (HOSPITAL_COMMUNITY): Payer: Self-pay

## 2014-10-03 ENCOUNTER — Emergency Department (HOSPITAL_COMMUNITY)
Admission: EM | Admit: 2014-10-03 | Discharge: 2014-10-03 | Disposition: A | Discharge status: Home / Self Care | Payer: Self-pay | Attending: Emergency Medicine | Admitting: Emergency Medicine

## 2014-10-03 ENCOUNTER — Encounter (HOSPITAL_COMMUNITY): Payer: Self-pay | Admitting: Emergency Medicine

## 2014-10-03 DIAGNOSIS — R079 Chest pain, unspecified: Secondary | ICD-10-CM | POA: Insufficient documentation

## 2014-10-03 DIAGNOSIS — R0602 Shortness of breath: Secondary | ICD-10-CM | POA: Insufficient documentation

## 2014-10-03 LAB — BASIC METABOLIC PANEL
Anion gap: 6 (ref 5–15)
BUN: 13 mg/dL (ref 6–20)
CALCIUM: 9.3 mg/dL (ref 8.9–10.3)
CO2: 28 mmol/L (ref 22–32)
CREATININE: 0.92 mg/dL (ref 0.61–1.24)
Chloride: 103 mmol/L (ref 101–111)
GFR calc non Af Amer: >60 mL/min (ref >60)
Glucose, Bld: 88 mg/dL (ref 65–99)
Potassium: 4 mmol/L (ref 3.5–5.1)
SODIUM: 137 mmol/L (ref 135–145)

## 2014-10-03 LAB — CBC
HCT: 45.8 % (ref 39.0–52.0)
Hemoglobin: 15.1 g/dL (ref 13.0–17.0)
MCH: 30.3 pg (ref 26.0–34.0)
MCHC: 33 g/dL (ref 30.0–36.0)
MCV: 92 fL (ref 78.0–100.0)
PLATELETS: 203 10*3/uL (ref 150–400)
RBC: 4.98 MIL/uL (ref 4.22–5.81)
RDW: 13.3 % (ref 11.5–15.5)
WBC: 5.3 10*3/uL (ref 4.0–10.5)

## 2014-10-03 LAB — I-STAT TROPONIN, ED: TROPONIN I, POC: 0.01 ng/mL (ref 0.00–0.08)

## 2014-10-03 MED ORDER — NAPROXEN 500 MG PO TABS
500.0000 mg | ORAL_TABLET | Freq: Once | ORAL | Status: AC
  Administered 2014-10-03: 500 mg via ORAL
  Filled 2014-10-03: qty 1

## 2014-10-03 NOTE — Discharge Instructions (Signed)
Chest Pain (Nonspecific) °It is often hard to give a specific diagnosis for the cause of chest pain. There is always a chance that your pain could be related to something serious, such as a heart attack or a blood clot in the lungs. You need to follow up with your health care provider for further evaluation. °CAUSES  °· Heartburn. °· Pneumonia or bronchitis. °· Anxiety or stress. °· Inflammation around your heart (pericarditis) or lung (pleuritis or pleurisy). °· A blood clot in the lung. °· A collapsed lung (pneumothorax). It can develop suddenly on its own (spontaneous pneumothorax) or from trauma to the chest. °· Shingles infection (herpes zoster virus). °The chest wall is composed of bones, muscles, and cartilage. Any of these can be the source of the pain. °· The bones can be bruised by injury. °· The muscles or cartilage can be strained by coughing or overwork. °· The cartilage can be affected by inflammation and become sore (costochondritis). °DIAGNOSIS  °Lab tests or other studies may be needed to find the cause of your pain. Your health care provider may have you take a test called an ambulatory electrocardiogram (ECG). An ECG records your heartbeat patterns over a 24-hour period. You may also have other tests, such as: °· Transthoracic echocardiogram (TTE). During echocardiography, sound waves are used to evaluate how blood flows through your heart. °· Transesophageal echocardiogram (TEE). °· Cardiac monitoring. This allows your health care provider to monitor your heart rate and rhythm in real time. °· Holter monitor. This is a portable device that records your heartbeat and can help diagnose heart arrhythmias. It allows your health care provider to track your heart activity for several days, if needed. °· Stress tests by exercise or by giving medicine that makes the heart beat faster. °TREATMENT  °· Treatment depends on what may be causing your chest pain. Treatment may include: °¨ Acid blockers for  heartburn. °¨ Anti-inflammatory medicine. °¨ Pain medicine for inflammatory conditions. °¨ Antibiotics if an infection is present. °· You may be advised to change lifestyle habits. This includes stopping smoking and avoiding alcohol, caffeine, and chocolate. °· You may be advised to keep your head raised (elevated) when sleeping. This reduces the chance of acid going backward from your stomach into your esophagus. °Most of the time, nonspecific chest pain will improve within 2-3 days with rest and mild pain medicine.  °HOME CARE INSTRUCTIONS  °· If antibiotics were prescribed, take them as directed. Finish them even if you start to feel better. °· For the next few days, avoid physical activities that bring on chest pain. Continue physical activities as directed. °· Do not use any tobacco products, including cigarettes, chewing tobacco, or electronic cigarettes. °· Avoid drinking alcohol. °· Only take medicine as directed by your health care provider. °· Follow your health care provider's suggestions for further testing if your chest pain does not go away. °· Keep any follow-up appointments you made. If you do not go to an appointment, you could develop lasting (chronic) problems with pain. If there is any problem keeping an appointment, call to reschedule. °SEEK MEDICAL CARE IF:  °· Your chest pain does not go away, even after treatment. °· You have a rash with blisters on your chest. °· You have a fever. °SEEK IMMEDIATE MEDICAL CARE IF:  °· You have increased chest pain or pain that spreads to your arm, neck, jaw, back, or abdomen. °· You have shortness of breath. °· You have an increasing cough, or you cough   up blood. °· You have severe back or abdominal pain. °· You feel nauseous or vomit. °· You have severe weakness. °· You faint. °· You have chills. °This is an emergency. Do not wait to see if the pain will go away. Get medical help at once. Call your local emergency services (911 in [place] Do not drive  yourself to the hospital. °MAKE SURE YOU:  °· Understand these instructions. °· Will watch your condition. °· Will get help right away if you are not doing well or get worse. °Document Released: 10/14/2004 Document Revised: 01/09/2013 Document Reviewed: 08/10/2007 °ExitCare® Patient Information ©2015 [employer]. This information is not intended to replace advice given to you by your health care provider. Make sure you discuss any questions you have with your health care provider. ° ° °Emergency Department Resource Guide °1) Find a Doctor and Pay Out of Pocket °Although you won't have to find out who is covered by your insurance plan, it is a good idea to ask around and get recommendations. You will then need to call the office and see if the doctor you have chosen will accept you as a new patient and what types of options they offer for patients who are self-pay. Some doctors offer discounts or will set up payment plans for their patients who do not have insurance, but you will need to ask so you aren't surprised when you get to your appointment. ° °2) Contact Your Local Health Department °Not all health departments have doctors that can see patients for sick visits, but many do, so it is worth a call to see if yours does. If you don't know where your local health department is, you can check in your phone book. The CDC also has a tool to help you locate your state's health department, and many state websites also have listings of all of their local health departments. ° °3) Find a Walk-in Clinic °If your illness is not likely to be very severe or complicated, you may want to try a walk in clinic. These are popping up all over the country in pharmacies, drugstores, and shopping centers. They're usually staffed by nurse practitioners or physician assistants that have been trained to treat common illnesses and complaints. They're usually fairly quick and inexpensive. However, if you have serious medical issues or  chronic medical problems, these are probably not your best option. ° °No Primary Care Doctor: °- Call [facility] at  832-8000 - they can help you locate a primary care doctor that  accepts your insurance, provides certain services, etc. °- Physician Referral Service- 1-800-533-3463 ° °Chronic Pain Problems: °Organization         Address  Phone   Notes  °[facility]  (336) 297-2271 Patients need to be referred by their primary care doctor.  ° °Medication Assistance: °Organization         Address  Phone   Notes  °[place] [facility] [address] °[place], [place] [address] (336) 641-8030 --Must be a resident of [place] °-- Must have NO insurance coverage whatsoever (no Medicaid/ Medicare, etc.) °-- The pt. MUST have a primary care doctor that directs their care regularly and follows them in the community °  °[facility]  (866) 331-1348   °[employer]  (888) 892-1162   ° °Agencies that provide inexpensive medical care: °Organization         Address  Phone   Notes  °[facility]  (  336) 832-8035   °[facility]    (336) 832-7272   °[facility] [address] °[place], [place] [address] (336) 832-4777   °[facility] [address], °[place] (336) 271-4999   °[facility]    (336) 373-0678   °[facility]    (336) 272-1050   °[facility] ° [address], [place] Phone:  (336) 832-4444, Fax:  (336) 832-4440 Hours of Operation:  9 am - 6 pm, M-F.  Also accepts Medicaid/Medicare and self-pay.  °[facility] ° [address], [place] Phone: (336) 832-3150, Fax: (336) [phone]. Hours of Operation:  8:30 am - 5:30 pm, M-F.  Also accepts Medicaid and self-pay.  °[facility] [place] [address], [place] Phone: (336) 878-6027   °[facility] [address], [place], [place]  (336)723-1848, Ext. 123 Mondays & Thursdays: 7-9 AM.  First 15 patients are seen on a first come, first serve basis. °  ° °Medicaid-accepting [place] Providers: ° °Organization         Address  Phone   Notes  °[facility] [address], [place] (336) 641-2100 Also accepts self-pay patients.  °[facility] [address], [place] ° (336) 856-9996   °[facility] [address], [place] (336) 288-8857   °[facility] [address] [place] [address], [place] (336) 299-7000   °Veita Bland [address], [place]  ° (336) 373-1557 Only accepts [place] Access Medicaid patients after they have their name applied to their card.  ° °Self-Pay (no insurance) in [place]: ° °Organization         Address  Phone   Notes  °Sickle Cell Patients, [facility] [address], [place] (336) 832-1970   °[facility] [address], [place] (336) 832-4400   ° [facility] ° [address], [place] (336) 992-4800   °[facility]/Dr. Osei-Bonsu ° [address] [place] [address], [place] or [address], [place] (336) [phone] Phone number for both [place] and [place] locations is the same.  °[facility] [address], [place] (336) 299-0000   °[facility] [address] [place] [address], [place] or [address] (336) 852-7530 °(336) 878-2260   °[facility] [address], [place] (336) 350-1642, phone; (336) 294-5005, fax Sees patients 1st and 3rd Saturday of every month.  Must not qualify for public or private insurance (i.e. Medicaid, Medicare, [place] Health Choice, Veterans' Benefits) • Household income should be no more than 200% of the poverty level •The clinic cannot treat you if you are pregnant or think you are pregnant • Sexually transmitted  diseases are not treated at the clinic.  ° ° °Dental Care: °Organization         Address  Phone  Notes  °[facility] [address],  (336) 641-6152 Accepts children up to age 21 who are enrolled in Medicaid or [place] Health Choice; pregnant women with a Medicaid card; and children who have applied for Medicaid or [place] Health Choice, but were declined, whose parents can pay a reduced fee at time of service.  °[facility] [place]    [address], [place] (336) 641-7733 Accepts children up to age 21 who are enrolled in Medicaid or [place] Health Choice; pregnant women with a Medicaid card; and children who have applied for Medicaid or [place] Health Choice, but were declined, whose parents can pay a reduced fee at time of service.  °[facility] ° [address], [place] (336) 641-4533 Patients are seen by appointment only. Walk-ins are not accepted. [facility] will see patients 18 years of age and older. °Monday - Tuesday (8am-5pm) °Most Wednesdays (8:30-5pm) °$30 per visit, cash only  °[facility] ° [address], [place] (336) 641-4533 Patients are seen by appointment only. Walk-ins are not accepted. [facility] will see patients 18 years of age and older. °One Wednesday Evening (Monthly: Volunteer Based).  $30 per visit, cash only  °[facility]  (919) 537-3737 for adults; Children under age 4, call Graduate Pediatric Dentistry at ([phone]) [phone]. Children aged 4-14, please call (919) [phone] to request a pediatric application. ° Dental services are provided in all areas of dental care including fillings, crowns and bridges, complete and partial dentures, implants, gum treatment, root canals, and extractions. Preventive care is also provided. Treatment is provided to both adults and children. °Patients are selected via a  lottery and there is often a waiting list. °  °[facility] [address], °[place] ° (336) 763-8833 [email or web address] °  °[facility] [address], [place], [place] (336)723-1848, Ext. 123 Second and Fourth Thursday of each month, opens at 6:30 AM; Clinic ends at 9 AM.  Patients are seen on a first-come first-served basis, and a limited number are seen during each clinic.  ° °[facility] ° [address], [place], [place] (336) 723-7904   Eligibility Requirements °You must have lived in [place], [place], or [place] counties for at least the last three months. °  You cannot be eligible for state or federal sponsored healthcare insurance, including Veterans Administration, Medicaid, or Medicare. °  You generally cannot be eligible for healthcare insurance through your employer.  °  How to apply: °Eligibility screenings are held every Tuesday and Wednesday afternoon from 1:00 pm until 4:00 pm. You do not need an appointment for the interview!  °[facility] [address], [place], [place] 336-631-2330   °[facility]  336-342-8273   °[facility]  336-703-3100   °[facility]  336-570-6415   ° °Behavioral Health Resources in the Community: °Intensive Outpatient Programs °Organization         Address  Phone  Notes  °[facility] [address], [place], [place] 336-878-6098   °[facility] [address], [place], [place] 336-832-9800   °[facility] [address], [place], [place] ° 336-882-2125   °[facility] [address],  °[place],  1-800-853-5163 or 336-641-4981   °Substance Abuse Resources °Organization         Address  Phone  Notes  °[facility]  336-882-2125   °[facility]  336-784-9470   °[facility]  336-285-9073   °[facility]  336-845-3988   °Residential &  Outpatient Substance Abuse Program  1-800-659-3381   °Psychological Services °Organization         Address  Phone  Notes  °[facility]  336- 832-9600   °  [facility]  336- 378-7881   °[facility] [address], [place] 1-800-853-5163 or 336-641-4981   ° °Mobile Crisis Teams °Organization         Address  Phone  Notes  °[facility], Mobile Crisis Care Unit  1-877-626-1772   °Assertive °[facility] ° [address] [place], [place] 336-834-9664   °Sharon DeEsch [address] °[place] 336-554-5454   ° °Self-Help/Support Groups °Organization         Address  Phone             Notes  °[facility] - variety of support groups  336- 373-1402 Call for more information  °Narcotics Anonymous (NA), [facility] [address], °[place]  2 meetings at this location  ° °Residential Treatment Programs °Organization         Address  Phone  Notes  °[facility] [address],    °[place]  1-866-801-8205   °[facility] ° [address], [place], [place] 704-293-8524   °[facility] [address], [place] 336-845-3988 Admissions: 8am-3pm M-F  °[facility] [address]    °[place], [place] 336-841-1104   °[facility] [address], [place], [place] 336-379-7146   °[facility] [address]  °[place], [place] 336-285-9073   °[facility] Intensive Outpatient [address], [place], [place] 336-852-3033   °[facility] [address]  °[place], [place] 1-877-615-2722 or 336-784-9470   °[facility] [address] [place], [place] 336-227-7417 Accepts Medicaid  °[facility] [address]  ° [place] 1-800-659-3381 Substance Abuse/Addiction Treatment  ° °[place] Behavioral Health Resources °Organization          Address  Phone  Notes  °[facility]  (888) 581-9988   °Julie Brannon, PhD [address] [place], [place]   (336) 349-5553 or (336) 951-0000   °[facility]   [address] °[place], [place] (336) 349-4454   °[facility] [address], [place], [place] (336) 342-8316 Insurance/Medicaid/sponsorship through [facility]  °[facility] [address]                                    [place], [place] (336) 342-8316 Therapy/tele-psych/case  °[facility] [address]  ° [place], [place] (336) 349-2233    °Dr. Arfeen  (336) 349-4544   °[facility] [place]  [employer] [place] [facility] 1) [address], [place] °2) [address], [place] °3)  [address] [place] [address], [place] (336) 349-3220 °(336) 342-7768 ° °(336) 342-8140   °[place] [facility] (336) 342-1394 or (336) 342-3537 (After Hours)    ° ° ° °

## 2014-10-03 NOTE — ED Provider Notes (Signed)
CSN: [id number]     Arrival date & time 10/03/14  0941 History   First MD Initiated Contact with Patient 10/03/14 1126     Chief Complaint  Patient presents with  . Chest Pain    radiates to left side     (Consider location/radiation/quality/duration/timing/severity/associated sxs/prior Treatment) Patient is a 49 y.o. male presenting with chest pain.  Chest Pain Pain location:  Substernal area and L chest Pain quality: aching   Pain radiates to:  Does not radiate Pain severity:  Moderate Onset quality:  Gradual Duration:  4 days Timing:  Constant Progression:  Unchanged Chronicity:  New Context comment:  Spontaneous Relieved by:  Nothing Worsened by:  Movement Associated symptoms: shortness of breath   Associated symptoms: no cough, no diaphoresis, no dizziness, no fever, no nausea, no near-syncope and not vomiting     History reviewed. No pertinent past medical history. Past Surgical History  Procedure Laterality Date  . Wrist surgery     History reviewed. No pertinent family history. Social History  Substance Use Topics  . Smoking status: Never Smoker   . Smokeless tobacco: None  . Alcohol Use: No    Review of Systems  Constitutional: Negative for fever and diaphoresis.  Respiratory: Positive for shortness of breath. Negative for cough.   Cardiovascular: Positive for chest pain. Negative for near-syncope.  Gastrointestinal: Negative for nausea and vomiting.  Neurological: Negative for dizziness.  All other systems reviewed and are negative.     Allergies  Review of patient's allergies indicates no known allergies.  Home Medications   Prior to Admission medications   Medication Sig Start Date End Date Taking? Authorizing Provider  meloxicam (MOBIC) 7.5 MG tablet Take 1-2 tabs daily as needed for pain to help with inflammation. Patient not taking: Reported on 08/13/2014 11/07/13   Junius Finner, PA-C  methocarbamol (ROBAXIN) 500 MG tablet Take 1 tablet  (500 mg total) by mouth 2 (two) times daily as needed for muscle spasms. Patient not taking: Reported on 10/03/2014 08/13/14   Everlene Farrier, PA-C  naproxen (NAPROSYN) 250 MG tablet Take 1 tablet (250 mg total) by mouth 2 (two) times daily with a meal. Patient not taking: Reported on 10/03/2014 08/13/14   Everlene Farrier, PA-C   BP 101/69 mmHg  Pulse 62  Temp(Src) 98.1 F (36.7 C) (Oral)  Resp 20  SpO2 99% Physical Exam  Constitutional: He is oriented to person, place, and time. He appears well-developed and well-nourished. No distress.  HENT:  Head: Normocephalic and atraumatic.  Mouth/Throat: Oropharynx is clear and moist.  Eyes: Conjunctivae are normal. Pupils are equal, round, and reactive to light. No scleral icterus.  Neck: Neck supple.  Cardiovascular: Normal rate, regular rhythm, normal heart sounds and intact distal pulses.   No murmur heard. Pulmonary/Chest: Effort normal and breath sounds normal. No stridor. No respiratory distress. He has no wheezes. He has no rales. He exhibits tenderness (left lateral chest wall).  Abdominal: Soft. He exhibits no distension. There is no tenderness.  Musculoskeletal: Normal range of motion. He exhibits no edema.  Neurological: He is alert and oriented to person, place, and time.  Skin: Skin is warm and dry. No rash noted.  Psychiatric: He has a normal mood and affect. His behavior is normal.  Nursing note and vitals reviewed.   ED Course  Procedures (including critical care time) Labs Review Labs Reviewed  CBC  BASIC METABOLIC PANEL  Rosezena Sensor, ED    Imaging Review Dg Chest 2 View  10/03/2014  CLINICAL DATA:  Mid left chest pain worsening since yesterday. Shortness of breath.  EXAM: CHEST  2 VIEW  COMPARISON:  05/16/2005  FINDINGS: The heart size and mediastinal contours are within normal limits. Both lungs are clear. The visualized skeletal structures are unremarkable.  IMPRESSION: No active cardiopulmonary disease.    Electronically Signed   By: Gaylyn Rong M.D.   On: 10/03/2014 10:33   I have personally reviewed and evaluated these images and lab results as part of my medical decision-making.   EKG Interpretation   Date/Time:  Thursday October 03 2014 09:47:22 EDT Ventricular Rate:  60 PR Interval:  123 QRS Duration: 103 QT Interval:  393 QTC Calculation: 393 R Axis:   101 Text Interpretation:  Sinus rhythm Right axis deviation ST elevation  suggests acute pericarditis Confirmed by Ethelda Chick  MD, SAM [phone]) on  10/03/2014 9:54:07 AM      MDM   Final diagnoses:  Chest pain, unspecified chest pain type    49 year old male with chest pain. It started substernal, but has since moved to his left lateral chest.  He has no cardiac risk factors and history is atypical for ACS. History also atypical for PE and he is PERC negative.  EKG shows some diffuse ST elevations.  These are similar to prior EKG and most likely represent early repolarization.  I do not think he has acute pericarditis.  He has no prodromal illnesses.  His left chest wall is tender to palpation.  Clinical picture most suggests MSK chest pain.  Will treat with NSAIDs.      Blake Divine, MD 10/03/14 [phone]

## 2014-10-03 NOTE — ED Notes (Signed)
Patient c/o central chest pain started yesterday, states it is now hurting down his side. Patient denies prior symptoms of same. Patient states he is concerned because the pain is ongoing.

## 2015-01-28 ENCOUNTER — Encounter (HOSPITAL_COMMUNITY): Payer: Self-pay

## 2015-01-28 ENCOUNTER — Emergency Department (HOSPITAL_COMMUNITY)
Admission: EM | Admit: 2015-01-28 | Discharge: 2015-01-28 | Disposition: A | Discharge status: Home / Self Care | Payer: Self-pay | Attending: Emergency Medicine | Admitting: Emergency Medicine

## 2015-01-28 ENCOUNTER — Emergency Department (HOSPITAL_COMMUNITY): Payer: Self-pay

## 2015-01-28 DIAGNOSIS — S8992XA Unspecified injury of left lower leg, initial encounter: Secondary | ICD-10-CM | POA: Insufficient documentation

## 2015-01-28 DIAGNOSIS — Y998 Other external cause status: Secondary | ICD-10-CM | POA: Insufficient documentation

## 2015-01-28 DIAGNOSIS — Y9289 Other specified places as the place of occurrence of the external cause: Secondary | ICD-10-CM | POA: Insufficient documentation

## 2015-01-28 DIAGNOSIS — W19XXXA Unspecified fall, initial encounter: Secondary | ICD-10-CM

## 2015-01-28 DIAGNOSIS — S86911A Strain of unspecified muscle(s) and tendon(s) at lower leg level, right leg, initial encounter: Secondary | ICD-10-CM | POA: Insufficient documentation

## 2015-01-28 DIAGNOSIS — S8391XA Sprain of unspecified site of right knee, initial encounter: Secondary | ICD-10-CM | POA: Insufficient documentation

## 2015-01-28 DIAGNOSIS — Y9389 Activity, other specified: Secondary | ICD-10-CM | POA: Insufficient documentation

## 2015-01-28 DIAGNOSIS — W1849XA Other slipping, tripping and stumbling without falling, initial encounter: Secondary | ICD-10-CM | POA: Insufficient documentation

## 2015-01-28 MED ORDER — TRAMADOL HCL 50 MG PO TABS: 50.0000 mg | ORAL_TABLET | Freq: Four times a day (QID) | ORAL | Status: DC | PRN

## 2015-01-28 MED ORDER — IBUPROFEN 800 MG PO TABS: 800.0000 mg | ORAL_TABLET | Freq: Three times a day (TID) | ORAL | Status: DC

## 2015-01-28 NOTE — Discharge Instructions (Signed)
Knee Sprain °A knee sprain is a tear in the strong bands of tissue that connect the bones (ligaments) of your knee. °HOME CARE °· Raise (elevate) your injured knee to lessen puffiness (swelling). °· To ease pain and puffiness, put ice on the injured area. °¨ Put ice in a plastic bag. °¨ Place a towel between your skin and the bag. °¨ Leave the ice on for 20 minutes, 2-3 times a day. °· Only take medicine as told by your doctor. °· Do not leave your knee unprotected until pain and stiffness go away (usually 4-6 weeks). °· If you have a cast or splint, do not get it wet. If your doctor told you to not take it off, cover it with a plastic bag when you shower or bathe. Do not swim. °· Your doctor may have you do exercises to prevent or limit permanent weakness and stiffness. °GET HELP RIGHT AWAY IF:  °· Your cast or splint becomes damaged. °· Your pain gets worse. °· You have a lot of pain, puffiness, or numbness below the cast or splint. °MAKE SURE YOU:  °· Understand these instructions. °· Will watch your condition. °· Will get help right away if you are not doing well or get worse. °  °This information is not intended to replace advice given to you by your health care provider. Make sure you discuss any questions you have with your health care provider. °  °Document Released: 12/23/2008 Document Revised: 01/09/2013 Document Reviewed: 09/12/2012 °Elsevier Interactive Patient Education ©2016 [employer] ° °Cryotherapy °Cryotherapy means treatment with cold. Ice or gel packs can be used to reduce both pain and swelling. Ice is the most helpful within the first 24 to 48 hours after an injury or flare-up from overusing a muscle or joint. Sprains, strains, spasms, burning pain, shooting pain, and aches can all be eased with ice. Ice can also be used when recovering from surgery. Ice is effective, has very few side effects, and is safe for most people to use. °PRECAUTIONS  °Ice is not a safe treatment option for people  with: °· Raynaud phenomenon. This is a condition affecting small blood vessels in the extremities. Exposure to cold may cause your problems to return. °· Cold hypersensitivity. There are many forms of cold hypersensitivity, including: °¨ Cold urticaria. Red, itchy hives appear on the skin when the tissues begin to warm after being iced. °¨ Cold erythema. This is a red, itchy rash caused by exposure to cold. °¨ Cold hemoglobinuria. Red blood cells break down when the tissues begin to warm after being iced. The hemoglobin that carry oxygen are passed into the urine because they cannot combine with blood proteins fast enough. °· Numbness or altered sensitivity in the area being iced. °If you have any of the following conditions, do not use ice until you have discussed cryotherapy with your caregiver: °· Heart conditions, such as arrhythmia, angina, or chronic heart disease. °· High blood pressure. °· Healing wounds or open skin in the area being iced. °· Current infections. °· Rheumatoid arthritis. °· Poor circulation. °· Diabetes. °Ice slows the blood flow in the region it is applied. This is beneficial when trying to stop inflamed tissues from spreading irritating chemicals to surrounding tissues. However, if you expose your skin to cold temperatures for too long or without the proper protection, you can damage your skin or nerves. Watch for signs of skin damage due to cold. °HOME CARE INSTRUCTIONS °Follow these tips to use ice and cold   packs safely. °· Place a dry or damp towel between the ice and skin. A damp towel will cool the skin more quickly, so you may need to shorten the time that the ice is used. °· For a more rapid response, add gentle compression to the ice. °· Ice for no more than 10 to 20 minutes at a time. The bonier the area you are icing, the less time it will take to get the benefits of ice. °· Check your skin after 5 minutes to make sure there are no signs of a poor response to cold or skin  damage. °· Rest 20 minutes or more between uses. °· Once your skin is numb, you can end your treatment. You can test numbness by very lightly touching your skin. The touch should be so light that you do not see the skin dimple from the pressure of your fingertip. When using ice, most people will feel these normal sensations in this order: cold, burning, aching, and numbness. °· Do not use ice on someone who cannot communicate their responses to pain, such as small children or people with dementia. °HOW TO MAKE AN ICE PACK °Ice packs are the most common way to use ice therapy. Other methods include ice massage, ice baths, and cryosprays. Muscle creams that cause a cold, tingly feeling do not offer the same benefits that ice offers and should not be used as a substitute unless recommended by your caregiver. °To make an ice pack, do one of the following: °· Place crushed ice or a bag of frozen vegetables in a sealable plastic bag. Squeeze out the excess air. Place this bag inside another plastic bag. Slide the bag into a pillowcase or place a damp towel between your skin and the bag. °· Mix 3 parts water with 1 part rubbing alcohol. Freeze the mixture in a sealable plastic bag. When you remove the mixture from the freezer, it will be slushy. Squeeze out the excess air. Place this bag inside another plastic bag. Slide the bag into a pillowcase or place a damp towel between your skin and the bag. °SEEK MEDICAL CARE IF: °· You develop white spots on your skin. This may give the skin a blotchy (mottled) appearance. °· Your skin turns blue or pale. °· Your skin becomes waxy or hard. °· Your swelling gets worse. °MAKE SURE YOU:  °· Understand these instructions. °· Will watch your condition. °· Will get help right away if you are not doing well or get worse. °  °This information is not intended to replace advice given to you by your health care provider. Make sure you discuss any questions you have with your health care  provider. °  °Document Released: 08/31/2010 Document Revised: 01/25/2014 Document Reviewed: 08/31/2010 °Elsevier Interactive Patient Education ©2016 [employer] Inc[employer] ° °

## 2015-01-28 NOTE — ED Provider Notes (Signed)
CSN: [id number]     Arrival date & time 01/28/15  4098 History   First MD Initiated Contact with Patient 01/28/15 [phone]     Chief Complaint  Patient presents with  . Knee Pain     (Consider location/radiation/quality/duration/timing/severity/associated sxs/prior Treatment) HPI Comments: Patient presents for evaluation of right knee injury. Patient reports that he slipped on ice this morning, twisted her knee and heard and felt a pop. Patient complaining of pain on bilateral sides of the knee since the injury. Pain is moderate to severe and worsens with movement. He did not fall to the ground or injure his head or back.  Patient is a 50 y.o. male presenting with knee pain.  Knee Pain   History reviewed. No pertinent past medical history. Past Surgical History  Procedure Laterality Date  . Wrist surgery     History reviewed. No pertinent family history. Social History  Substance Use Topics  . Smoking status: Never Smoker   . Smokeless tobacco: None  . Alcohol Use: No    Review of Systems  Musculoskeletal: Positive for arthralgias.  Neurological: Negative.       Allergies  Review of patient's allergies indicates no known allergies.  Home Medications   Prior to Admission medications   Medication Sig Start Date End Date Taking? Authorizing Provider  meloxicam (MOBIC) 7.5 MG tablet Take 1-2 tabs daily as needed for pain to help with inflammation. Patient not taking: Reported on 08/13/2014 11/07/13   Junius Finner, PA-C  methocarbamol (ROBAXIN) 500 MG tablet Take 1 tablet (500 mg total) by mouth 2 (two) times daily as needed for muscle spasms. Patient not taking: Reported on 10/03/2014 08/13/14   Everlene Farrier, PA-C  naproxen (NAPROSYN) 250 MG tablet Take 1 tablet (250 mg total) by mouth 2 (two) times daily with a meal. Patient not taking: Reported on 10/03/2014 08/13/14   Everlene Farrier, PA-C   BP 111/88 mmHg  Pulse 68  Temp(Src) 97.3 F (36.3 C) (Oral)  Resp 18  SpO2  100% Physical Exam  Musculoskeletal:       Right knee: He exhibits normal range of motion, no swelling, no effusion, no ecchymosis, no deformity, normal alignment, no LCL laxity, normal patellar mobility and no MCL laxity. Tenderness found. Medial joint line and lateral joint line tenderness noted.  Neurological: He has normal strength. No sensory deficit.  Skin: Skin is warm and dry. No abrasion, no ecchymosis and no laceration noted. No erythema.    ED Course  Procedures (including critical care time) Labs Review Labs Reviewed - No data to display  Imaging Review Dg Knee Complete 4 Views Right  01/28/2015  CLINICAL DATA:  50 year old male with fall and right knee pain. EXAM: RIGHT KNEE - COMPLETE 4+ VIEW COMPARISON:  Radiograph dated 11/01/2013 FINDINGS: There is no acute fracture or dislocation. There is mild narrowing of the medial compartment compatible with osteoarthritic changes. No suprapatellar effusion. Soft tissues are unremarkable. IMPRESSION: Negative. Electronically Signed   By: Elgie Collard M.D.   On: 01/28/2015 05:57   I have personally reviewed and evaluated these images and lab results as part of my medical decision-making.   EKG Interpretation None      MDM   Final diagnoses:  Fall   knee sprain  Resents for evaluation of right knee pain. Patient had a near fall on ice and twisted the knee. Examination reveals pain with stressing lateral and medial collateral ligaments, but there is no anterior drawer or ligamentous instability. Patient does have  some painful inhibition with exam, however. No effusion appreciated. X-ray negative. Will treat with rest, ice, analgesia. Knee immobilizer provided. Follow-up with orthopedics if no improvement in 3-5 days. Referral provided.    Gilda Creasehristopher J Pollina, MD 01/28/15 [phone]

## 2015-01-28 NOTE — ED Notes (Signed)
Pt slipped on the ice this am and heard his knee pop

## 2015-02-18 ENCOUNTER — Encounter (HOSPITAL_COMMUNITY): Payer: Self-pay | Admitting: Emergency Medicine

## 2015-02-18 ENCOUNTER — Emergency Department (HOSPITAL_COMMUNITY): Payer: Self-pay

## 2015-02-18 ENCOUNTER — Emergency Department (HOSPITAL_COMMUNITY)
Admission: EM | Admit: 2015-02-18 | Discharge: 2015-02-18 | Disposition: A | Discharge status: Home / Self Care | Payer: Self-pay | Attending: Physician Assistant | Admitting: Physician Assistant

## 2015-02-18 DIAGNOSIS — Y998 Other external cause status: Secondary | ICD-10-CM | POA: Insufficient documentation

## 2015-02-18 DIAGNOSIS — Y9301 Activity, walking, marching and hiking: Secondary | ICD-10-CM | POA: Insufficient documentation

## 2015-02-18 DIAGNOSIS — Y9289 Other specified places as the place of occurrence of the external cause: Secondary | ICD-10-CM | POA: Insufficient documentation

## 2015-02-18 DIAGNOSIS — W108XXA Fall (on) (from) other stairs and steps, initial encounter: Secondary | ICD-10-CM | POA: Insufficient documentation

## 2015-02-18 DIAGNOSIS — S8391XA Sprain of unspecified site of right knee, initial encounter: Secondary | ICD-10-CM | POA: Insufficient documentation

## 2015-02-18 MED ORDER — NAPROXEN 500 MG PO TABS: 500.0000 mg | ORAL_TABLET | Freq: Two times a day (BID) | ORAL | Status: DC

## 2015-02-18 NOTE — ED Notes (Signed)
Per patient, he has right knee pain that started earlier today.  Patient fell earlier today but had no LOC, n/v/d.  He has popping, clicking, and grinding in right knee.  He has swelling.

## 2015-02-18 NOTE — ED Provider Notes (Signed)
CSN: [id number]     Arrival date & time 02/18/15  1413 History  By signing my name below, I, Nicholas Howell, attest that this documentation has been prepared under the direction and in the presence of Arthor Captain, PA-C Electronically Signed: Charlean Merl, ED Scribe 02/18/2015 at 4:09 PM.    Chief Complaint  Patient presents with  . Knee Pain   The history is provided by the patient. No language interpreter was used.   HPI Comments: Nicholas Howell is a 50 y.o. male who presents to the Emergency Department complaining of right knee pain which began around this morning at 7am while pt was walking down steps. He felt a pop on the lateral side of his knee, felt sudden onset, 8/10 pain, and he fell onto his right knee. Pt denies any other pain, head injury, or LOC. Pain is exacerbated with movement, but pt is ambulatory with a limp. Pt went home immediately after the injury, iced the injury and took ibuprofen with some relief. Pt was seen in the ED 3 weeks ago s/p a fall on ice when he initially injured his right knee. He was discharged and given a knee immobilizer. Pt wore it for a couple days but stopped wearing it because it was too big and bulky and it interfered with his job. Pt has had mechanical symptoms of painful popping, locking, catching and feelings of instability intermittently since discharge.   History reviewed. No pertinent past medical history. Past Surgical History  Procedure Laterality Date  . Wrist surgery     No family history on file. Social History  Substance Use Topics  . Smoking status: Never Smoker   . Smokeless tobacco: None  . Alcohol Use: No    Review of Systems  Musculoskeletal: Positive for arthralgias.  Neurological: Negative for syncope and headaches.   Allergies  Review of patient's allergies indicates no known allergies.  Home Medications   Prior to Admission medications   Medication Sig Start Date End Date Taking? Authorizing Provider   naproxen (NAPROSYN) 500 MG tablet Take 1 tablet (500 mg total) by mouth 2 (two) times daily with a meal. 02/18/15   Adana Marik, PA-C   BP 115/75 mmHg  Pulse 73  Temp(Src) 98.5 F (36.9 C) (Oral)  Resp 18  SpO2 98% Physical Exam  Constitutional: He is oriented to person, place, and time. He appears well-developed and well-nourished. No distress.  HENT:  Head: Normocephalic and atraumatic.  Eyes: Conjunctivae and EOM are normal.  Neck: Neck supple. No tracheal deviation present.  Cardiovascular: Normal rate.   Pulmonary/Chest: Effort normal. No respiratory distress.  Musculoskeletal: Normal range of motion.  Pain on tight lateral aspect of knee. No swelling or deformity. Stable to ligament stressing.   Neurological: He is alert and oriented to person, place, and time.  Skin: Skin is warm and dry.  Psychiatric: He has a normal mood and affect. His behavior is normal.  Nursing note and vitals reviewed.   ED Course  Procedures  DIAGNOSTIC STUDIES: Oxygen Saturation is 98% on RA, normal by my interpretation.    COORDINATION OF CARE:  2:48 PM-Discussed treatment plan which includes DG right knee  with pt at bedside and pt agreed to plan.   Imaging Review Dg Knee Complete 4 Views Right  02/18/2015  CLINICAL DATA:  Right knee gave way this morning walking down steps. Landed on knees. EXAM: RIGHT KNEE - COMPLETE 4+ VIEW COMPARISON:  01/28/2015 FINDINGS: Mild degenerative changes in the right knee with  joint space narrowing and spurring most pronounced in the medial and patellofemoral compartments. No acute bony abnormality. Specifically, no fracture, subluxation, or dislocation. Soft tissues are intact. No joint effusion. IMPRESSION: No acute bony abnormality. Electronically Signed   By: Charlett Nose M.D.   On: 02/18/2015 15:25   I have personally reviewed and evaluated these images and lab results as part of my medical decision-making.   MDM   Final diagnoses:  Knee sprain,  right, initial encounter  Patient to ED for knee pain , x-ray is negative for dislocation or obvious fracture. Pt advised to follow up with orthopedics while in ED, conservative therapy recommended and discussed. Patient will be discharged home & is agreeable with above plan. Returns precautions discussed. Pt appears safe for discharge. I personally performed the services described in this documentation, which was scribed in my presence. The recorded information has been reviewed and is accurate.     Arthor Captain, PA-C 02/19/15 1534  Courteney Randall An, MD 02/21/15 [phone]

## 2015-02-18 NOTE — Discharge Instructions (Signed)
Cryotherapy °Cryotherapy means treatment with cold. Ice or gel packs can be used to reduce both pain and swelling. Ice is the most helpful within the first 24 to 48 hours after an injury or flare-up from overusing a muscle or joint. Sprains, strains, spasms, burning pain, shooting pain, and aches can all be eased with ice. Ice can also be used when recovering from surgery. Ice is effective, has very few side effects, and is safe for most people to use. °PRECAUTIONS  °Ice is not a safe treatment option for people with: °· Raynaud phenomenon. This is a condition affecting small blood vessels in the extremities. Exposure to cold may cause your problems to return. °· Cold hypersensitivity. There are many forms of cold hypersensitivity, including: °· Cold urticaria. Red, itchy hives appear on the skin when the tissues begin to warm after being iced. °· Cold erythema. This is a red, itchy rash caused by exposure to cold. °· Cold hemoglobinuria. Red blood cells break down when the tissues begin to warm after being iced. The hemoglobin that carry oxygen are passed into the urine because they cannot combine with blood proteins fast enough. °· Numbness or altered sensitivity in the area being iced. °If you have any of the following conditions, do not use ice until you have discussed cryotherapy with your caregiver: °· Heart conditions, such as arrhythmia, angina, or chronic heart disease. °· High blood pressure. °· Healing wounds or open skin in the area being iced. °· Current infections. °· Rheumatoid arthritis. °· Poor circulation. °· Diabetes. °Ice slows the blood flow in the region it is applied. This is beneficial when trying to stop inflamed tissues from spreading irritating chemicals to surrounding tissues. However, if you expose your skin to cold temperatures for too long or without the proper protection, you can damage your skin or nerves. Watch for signs of skin damage due to cold. °HOME CARE INSTRUCTIONS °Follow  these tips to use ice and cold packs safely. °· Place a dry or damp towel between the ice and skin. A damp towel will cool the skin more quickly, so you may need to shorten the time that the ice is used. °· For a more rapid response, add gentle compression to the ice. °· Ice for no more than 10 to 20 minutes at a time. The bonier the area you are icing, the less time it will take to get the benefits of ice. °· Check your skin after 5 minutes to make sure there are no signs of a poor response to cold or skin damage. °· Rest 20 minutes or more between uses. °· Once your skin is numb, you can end your treatment. You can test numbness by very lightly touching your skin. The touch should be so light that you do not see the skin dimple from the pressure of your fingertip. When using ice, most people will feel these normal sensations in this order: cold, burning, aching, and numbness. °· Do not use ice on someone who cannot communicate their responses to pain, such as small children or people with dementia. °HOW TO MAKE AN ICE PACK °Ice packs are the most common way to use ice therapy. Other methods include ice massage, ice baths, and cryosprays. Muscle creams that cause a cold, tingly feeling do not offer the same benefits that ice offers and should not be used as a substitute unless recommended by your caregiver. °To make an ice pack, do one of the following: °· Place crushed ice or a   bag of frozen vegetables in a sealable plastic bag. Squeeze out the excess air. Place this bag inside another plastic bag. Slide the bag into a pillowcase or place a damp towel between your skin and the bag.  Mix 3 parts water with 1 part rubbing alcohol. Freeze the mixture in a sealable plastic bag. When you remove the mixture from the freezer, it will be slushy. Squeeze out the excess air. Place this bag inside another plastic bag. Slide the bag into a pillowcase or place a damp towel between your skin and the bag. SEEK MEDICAL CARE  IF:  You develop white spots on your skin. This may give the skin a blotchy (mottled) appearance.  Your skin turns blue or pale.  Your skin becomes waxy or hard.  Your swelling gets worse. MAKE SURE YOU:   Understand these instructions.  Will watch your condition.  Will get help right away if you are not doing well or get worse.   This information is not intended to replace advice given to you by your health care provider. Make sure you discuss any questions you have with your health care provider.   Document Released: 08/31/2010 Document Revised: 01/25/2014 Document Reviewed: 08/31/2010 Elsevier Interactive Patient Education 2016 [employer]  Knee Sprain A knee sprain is a tear in one of the strong, fibrous tissues that connect the bones (ligaments) in your knee. The severity of the sprain depends on how much of the ligament is torn. The tear can be either partial or complete. CAUSES  Often, sprains are a result of a fall or injury. The force of the impact causes the fibers of your ligament to stretch too much. This excess tension causes the fibers of your ligament to tear. SIGNS AND SYMPTOMS  You may have some loss of motion in your knee. Other symptoms include:  Bruising.  Pain in the knee area.  Tenderness of the knee to the touch.  Swelling. DIAGNOSIS  To diagnose a knee sprain, your health care provider will physically examine your knee. Your health care provider may also suggest an X-ray exam of your knee to make sure no bones are broken. TREATMENT  If your ligament is only partially torn, treatment usually involves keeping the knee in a fixed position (immobilization) or bracing your knee for activities that require movement for several weeks. To do this, your health care provider will apply a bandage, cast, or splint to keep your knee from moving and to support your knee during movement until it heals. For a partially torn ligament, the healing process usually takes  4-6 weeks. If your ligament is completely torn, depending on which ligament it is, you may need surgery to reconnect the ligament to the bone or reconstruct it. After surgery, a cast or splint may be applied and will need to stay on your knee for 4-6 weeks while your ligament heals. HOME CARE INSTRUCTIONS  Keep your injured knee elevated to decrease swelling.  To ease pain and swelling, apply ice to the injured area:  Put ice in a plastic bag.  Place a towel between your skin and the bag.  Leave the ice on for 20 minutes, 2-3 times a day.  Only take medicine for pain as directed by your health care provider.  Do not leave your knee unprotected until pain and stiffness go away (usually 4-6 weeks).  If you have a cast or splint, do not allow it to get wet. If you have been instructed not to remove  it, cover it with a plastic bag when you shower or bathe. Do not swim. °· Your health care provider may suggest exercises for you to do during your recovery to prevent or limit permanent weakness and stiffness. °SEEK IMMEDIATE MEDICAL CARE IF: °· Your cast or splint becomes damaged. °· Your pain becomes worse. °· You have significant pain, swelling, or numbness below the cast or splint. °MAKE SURE YOU: °· Understand these instructions. °· Will watch your condition. °· Will get help right away if you are not doing well or get worse. °  °This information is not intended to replace advice given to you by your health care provider. Make sure you discuss any questions you have with your health care provider. °  °Document Released: 01/04/2005 Document Revised: 01/25/2014 Document Reviewed: 08/16/2012 °Elsevier Interactive Patient Education ©2016 [employer] ° °

## 2015-07-09 ENCOUNTER — Encounter (HOSPITAL_COMMUNITY): Payer: Self-pay | Admitting: *Deleted

## 2015-07-09 ENCOUNTER — Emergency Department (HOSPITAL_COMMUNITY): Payer: Self-pay

## 2015-07-09 ENCOUNTER — Emergency Department (HOSPITAL_COMMUNITY)
Admission: EM | Admit: 2015-07-09 | Discharge: 2015-07-09 | Disposition: A | Discharge status: Home / Self Care | Payer: Self-pay | Attending: Emergency Medicine | Admitting: Emergency Medicine

## 2015-07-09 DIAGNOSIS — M1710 Unilateral primary osteoarthritis, unspecified knee: Secondary | ICD-10-CM

## 2015-07-09 DIAGNOSIS — M1712 Unilateral primary osteoarthritis, left knee: Secondary | ICD-10-CM | POA: Insufficient documentation

## 2015-07-09 MED ORDER — NAPROXEN 500 MG PO TABS: 500.0000 mg | ORAL_TABLET | Freq: Two times a day (BID) | ORAL | Status: DC

## 2015-07-09 NOTE — ED Provider Notes (Signed)
CSN: [id number]     Arrival date & time 07/09/15  0430 History   First MD Initiated Contact with Patient 07/09/15 [phone]     Chief Complaint  Patient presents with  . Knee Pain     (Consider location/radiation/quality/duration/timing/severity/associated sxs/prior Treatment) HPI Comments: Pt comes in with cc of L knee pain. Pain has been nagging him off and on for a while now. This morning however, when he got up, his knee twisted and he fell. Pt is having tingling with pain in the knee now. Pt denies any other trauma.    Patient is a 50 y.o. male presenting with knee pain. The history is provided by the patient.  Knee Pain   History reviewed. No pertinent past medical history. Past Surgical History  Procedure Laterality Date  . Wrist surgery     No family history on file. Social History  Substance Use Topics  . Smoking status: Never Smoker   . Smokeless tobacco: None  . Alcohol Use: No    Review of Systems  Constitutional: Positive for activity change.  Musculoskeletal: Positive for arthralgias and gait problem.  Skin: Negative for rash and wound.  Hematological: Does not bruise/bleed easily.      Allergies  Review of patient's allergies indicates no known allergies.  Home Medications   Prior to Admission medications   Medication Sig Start Date End Date Taking? Authorizing Provider  naproxen (NAPROSYN) 500 MG tablet Take 1 tablet (500 mg total) by mouth 2 (two) times daily. 07/09/15   Lennell Shanks, MD   BP 101/61 mmHg  Pulse 57  Temp(Src) 97.3 F (36.3 C) (Oral)  Resp 18  SpO2 97% Physical Exam  Constitutional: He is oriented to person, place, and time. He appears well-developed.  HENT:  Head: Atraumatic.  Neck: Neck supple.  Cardiovascular: Normal rate.   Pulmonary/Chest: Effort normal.  Musculoskeletal: He exhibits edema and tenderness.  Knee has mild edema medially with neg anterior and posterior drawer test. Pt had tenderness with Valgus stress   Neurological: He is alert and oriented to person, place, and time.  Skin: Skin is warm.  Nursing note and vitals reviewed.   ED Course  Procedures (including critical care time) Labs Review Labs Reviewed - No data to display  Imaging Review Dg Knee Complete 4 Views Left  07/09/2015  CLINICAL DATA:  Larey SeatFell on LEFT knee at home this morning. Medial LEFT knee pain and soft tissue swelling. EXAM: LEFT KNEE - COMPLETE 4+ VIEW COMPARISON:  LEFT knee radiograph July 23, 2012 FINDINGS: No evidence of fracture, dislocation, or joint effusion. Similar tibial spine peaking and tricompartmental mild marginal spurring. No evidence of arthropathy or other focal bone abnormality. Soft tissues are unremarkable. IMPRESSION: Stable osteoarthrosis without acute fracture deformity or dislocation. Electronically Signed   By: Awilda Metroourtnay  Bloomer M.D.   On: 07/09/2015 05:04   I have personally reviewed and evaluated these images and lab results as part of my medical decision-making.   EKG Interpretation None      MDM   Final diagnoses:  Arthritis of knee    Pt has an arthritis knee based on exam. Able to ambulate. Advised RICE. Pt is to [facility] a knee sleeve [facility] [employer]. His job is working on Museum/gallery curatorgetting insurance started, so advised him to see a primary care doctor once he has insurance for optimal care.     Derwood KaplanAnkit Rober Skeels, MD 07/09/15 [phone]

## 2015-07-09 NOTE — ED Notes (Signed)
Pt from home, with c/o left knee pain and weakness. Sts was getting ready for work this morning when his left knee "gave in", he reports feeling tingling sensation and weakness in left knee.

## 2015-07-09 NOTE — ED Notes (Signed)
MD at bedside. 

## 2015-07-09 NOTE — Discharge Instructions (Signed)
You have arthritis of the knee. Please follow the RICE treatment:   Arthritis Arthritis is a term that is commonly used to refer to joint pain or joint disease. There are more than 100 types of arthritis. CAUSES The most common cause of this condition is wear and tear of a joint. Other causes include:  Gout.  Inflammation of a joint.  An infection of a joint.  Sprains and other injuries near the joint.  A drug reaction or allergic reaction. In some cases, the cause may not be known. SYMPTOMS The main symptom of this condition is pain in the joint with movement. Other symptoms include:  Redness, swelling, or stiffness at a joint.  Warmth coming from the joint.  Fever.  Overall feeling of illness. DIAGNOSIS This condition may be diagnosed with a physical exam and tests, including:  Blood tests.  Urine tests.  Imaging tests, such as MRI, X-rays, or a CT scan. Sometimes, fluid is removed from a joint for testing. TREATMENT Treatment for this condition may involve:  Treatment of the cause, if it is known.  Rest.  Raising (elevating) the joint.  Applying cold or hot packs to the joint.  Medicines to improve symptoms and reduce inflammation.  Injections of a steroid such as cortisone into the joint to help reduce pain and inflammation. Depending on the cause of your arthritis, you may need to make lifestyle changes to reduce stress on your joint. These changes may include exercising more and losing weight. HOME CARE INSTRUCTIONS Medicines  Take over-the-counter and prescription medicines only as told by your health care provider.  Do not take aspirin to relieve pain if gout is suspected. Activities  Rest your joint if told by your health care provider. Rest is important when your disease is active and your joint feels painful, swollen, or stiff.  Avoid activities that make the pain worse. It is important to balance activity with rest.  Exercise your joint  regularly with range-of-motion exercises as told by your health care provider. Try doing low-impact exercise, such as:  Swimming.  Water aerobics.  Biking.  Walking. Joint Care  If your joint is swollen, keep it elevated if told by your health care provider.  If your joint feels stiff in the morning, try taking a warm shower.  If directed, apply heat to the joint. If you have diabetes, do not apply heat without permission from your health care provider.  Put a towel between the joint and the hot pack or heating pad.  Leave the heat on the area for 20-30 minutes.  If directed, apply ice to the joint:  Put ice in a plastic bag.  Place a towel between your skin and the bag.  Leave the ice on for 20 minutes, 2-3 times per day.  Keep all follow-up visits as told by your health care provider. This is important. SEEK MEDICAL CARE IF:  The pain gets worse.  You have a fever. SEEK IMMEDIATE MEDICAL CARE IF:  You develop severe joint pain, swelling, or redness.  Many joints become painful and swollen.  You develop severe back pain.  You develop severe weakness in your leg.  You cannot control your bladder or bowels.   This information is not intended to replace advice given to you by your health care provider. Make sure you discuss any questions you have with your health care provider.   Document Released: 02/12/2004 Document Revised: 09/25/2014 Document Reviewed: 04/01/2014 Elsevier Interactive Patient Education 2016 [employer]. RICE for  Routine Care of Injuries Theroutine careofmanyinjuriesincludes rest, ice, compression, and elevation (RICE therapy). RICE therapy is often recommended for injuries to soft tissues, such as a muscle strain, ligament injuries, bruises, and overuse injuries. It can also be used for some bony injuries. Using RICE therapy can help to relieve pain, lessen swelling, and enable your body to heal. Rest Rest is required to allow your body  to heal. This usually involves reducing your normal activities and avoiding use of the injured part of your body. Generally, you can return to your normal activities when you are comfortable and have been given permission by your health care provider. Ice Icing your injury helps to keep the swelling down, and it lessens pain. Do not apply ice directly to your skin.  Put ice in a plastic bag.  Place a towel between your skin and the bag.  Leave the ice on for 20 minutes, 2-3 times a day. Do this for as long as you are directed by your health care provider. Compression Compression means putting pressure on the injured area. Compression helps to keep swelling down, gives support, and helps with discomfort. Compression may be done with an elastic bandage. If an elastic bandage has been applied, follow these general tips:  Remove and reapply the bandage every 3-4 hours or as directed by your health care provider.  Make sure the bandage is not wrapped too tightly, because this can cut off circulation. If part of your body beyond the bandage becomes blue, numb, cold, swollen, or more painful, your bandage is most likely too tight. If this occurs, remove your bandage and reapply it more loosely.  See your health care provider if the bandage seems to be making your problems worse rather than better. Elevation Elevation means keeping the injured area raised. This helps to lessen swelling and decrease pain. If possible, your injured area should be elevated at or above the level of your heart or the center of your chest. WHEN SHOULD I SEEK MEDICAL CARE? You should seek medical care if:  Your pain and swelling continue.  Your symptoms are getting worse rather than improving. These symptoms may indicate that further evaluation or further X-rays are needed. Sometimes, X-rays may not show a small broken bone (fracture) until a number of days later. Make a follow-up appointment with your health care  provider. WHEN SHOULD I SEEK IMMEDIATE MEDICAL CARE? You should seek immediate medical care if:  You have sudden severe pain at or below the area of your injury.  You have redness or increased swelling around your injury.  You have tingling or numbness at or below the area of your injury that does not improve after you remove the elastic bandage.   This information is not intended to replace advice given to you by your health care provider. Make sure you discuss any questions you have with your health care provider.   Document Released: 04/18/2000 Document Revised: 09/25/2014 Document Reviewed: 12/12/2013 Elsevier Interactive Patient Education [employer]2016 Elsevier Inc.

## 2015-12-05 ENCOUNTER — Emergency Department (HOSPITAL_COMMUNITY)
Admission: EM | Admit: 2015-12-05 | Discharge: 2015-12-05 | Disposition: A | Discharge status: Home / Self Care | Payer: PRIVATE HEALTH INSURANCE | Attending: Emergency Medicine | Admitting: Emergency Medicine

## 2015-12-05 ENCOUNTER — Encounter (HOSPITAL_COMMUNITY): Payer: Self-pay | Admitting: Emergency Medicine

## 2015-12-05 DIAGNOSIS — Z79899 Other long term (current) drug therapy: Secondary | ICD-10-CM | POA: Diagnosis not present

## 2015-12-05 DIAGNOSIS — M25561 Pain in right knee: Secondary | ICD-10-CM | POA: Diagnosis not present

## 2015-12-05 DIAGNOSIS — G8929 Other chronic pain: Secondary | ICD-10-CM | POA: Diagnosis not present

## 2015-12-05 DIAGNOSIS — M25562 Pain in left knee: Secondary | ICD-10-CM | POA: Diagnosis not present

## 2015-12-05 MED ORDER — NAPROXEN 500 MG PO TABS: 500.0000 mg | ORAL_TABLET | Freq: Two times a day (BID) | ORAL | 0 refills | Status: DC

## 2015-12-05 MED ORDER — ACETAMINOPHEN 325 MG PO TABS
650.0000 mg | ORAL_TABLET | Freq: Once | ORAL | Status: AC
  Administered 2015-12-05: 650 mg via ORAL
  Filled 2015-12-05: qty 2

## 2015-12-05 NOTE — Discharge Instructions (Signed)
Please continue to use the knee sleeve. Rest, ice, elevate the knees. Take the naproxen twice a day. Take Tylenol up to 3 times a day. Please keep your appointment with her orthopedist. Return to the ED if your symptoms worsen or if you develop reddening of the joints, fevers, inability to bend the joint.

## 2015-12-05 NOTE — ED Triage Notes (Signed)
Pt complaint of acute chronic bilateral knee pain worsening over past week. Pt reports hx of same "given some pain medications and swelling and pain got better." Pt denies injury.

## 2015-12-05 NOTE — ED Provider Notes (Signed)
[facility]-EMERGENCY DEPT Provider Note   CSN: [id number] Arrival date & time: 12/05/15  [id number]     History   Chief Complaint Chief Complaint  Patient presents with  . Knee Pain    HPI Nicholas Howell is a 50 y.o. male.  50 year old African American male past medical history significant for arthritis presents to the ED today with chronic bilateral knee pain. Patient states that he has chronic bilateral knee pain due to arthritis. He has an appointment scheduled with orthopedist on 12/24/15. Patient states however that his pain has worsened over the past week. Moving makes the pain worse. Nothing makes pain better. He has not tried anything at home for the pain. Patient states that he's been seen in the past in the ER for same and "they usually give him some pain medicine and the swelling and pain get better". Patient denies any new injury or trauma. He is able to ambulate with mild pain. Patient denies any fever, chills, or any other symptoms.       History reviewed. No pertinent past medical history.  There are no active problems to display for this patient.   Past Surgical History:  Procedure Laterality Date  . WRIST SURGERY         Home Medications    Prior to Admission medications   Medication Sig Start Date End Date Taking? Authorizing Provider  ibuprofen (ADVIL,MOTRIN) 200 MG tablet Take 600 mg by mouth every 8 (eight) hours as needed for moderate pain.   Yes Historical Provider, MD    Family History No family history on file.  Social History Social History  Substance Use Topics  . Smoking status: Never Smoker  . Smokeless tobacco: Not on file  . Alcohol use No     Allergies   Patient has no known allergies.   Review of Systems Review of Systems  Constitutional: Negative for chills and fever.  HENT: Negative for congestion.   Gastrointestinal: Negative for nausea and vomiting.  Musculoskeletal: Positive for arthralgias, gait problem and joint swelling.  Negative for back pain.  Skin: Negative for color change.  Neurological: Negative for weakness and numbness.     Physical Exam Updated Vital Signs BP 106/79 (BP Location: Left Arm)   Pulse 79   Temp 97.6 F (36.4 C) (Oral)   Resp 20   SpO2 96%   Physical Exam  Constitutional: He appears well-developed and well-nourished. No distress.  Eyes: Right eye exhibits no discharge. Left eye exhibits no discharge. No scleral icterus.  Cardiovascular:  Pulses:      Dorsalis pedis pulses are 2+ on the right side, and 2+ on the left side.  Pulmonary/Chest: No respiratory distress.  Musculoskeletal: Normal range of motion.  Tenderness to palpation of bilat knees to the lateral and medial joint line. No edema, effusion, erythema, ecchymosis noted. No laxity of the joint. Patella tracks normal. Strength is 5 out of 5 in the lower extremity as bilaterally. Cap refill is normal. Sensation intact. Full range of motion. No deformity noted. Negative Murphy sign. Negative ballottement test.  Neurological: He is alert.  Skin: Skin is warm and dry. Capillary refill takes less than 2 seconds. No pallor.  Nursing note and vitals reviewed.    ED Treatments / Results  Labs (all labs ordered are listed, but only abnormal results are displayed) Labs Reviewed - No data to display  EKG  EKG Interpretation None       Radiology No results found.  Procedures Procedures (including critical care  time)  Medications Ordered in ED Medications - No data to display   Initial Impression / Assessment and Plan / ED Course  I have reviewed the triage vital signs and the nursing notes.  Pertinent labs & imaging results that were available during my care of the patient were reviewed by me and considered in my medical decision making (see chart for details).  Clinical Course   Patient presents to ED with acute on chronic bilateral knee pain due to arthritis. Patient has appointment with orthopedist on 12/6.  Patient denies any new trauma. He has full range of motion of knees. No erythema, edema, effusion. Patient is afebrile and not tachycardic. Low suspicion for septic joint. Exam is consistent with arthritis. No new injuries. Imaging is not indicated at this time. Encourage patient to follow-up at his orthopedic appointment. Given knee sleeve NAD. We'll give prescription for naproxen and Tylenol. Pt is hemodynamically stable, in NAD, & able to ambulate in the ED. Pain has been managed & has no complaints prior to dc. Pt is comfortable with above plan and is stable for discharge at this time. All questions were answered prior to disposition. Strict return precautions for f/u to the ED were discussed including signs and symptoms of septic joint.    Final Clinical Impressions(s) / ED Diagnoses   Final diagnoses:  Chronic pain of both knees    New Prescriptions Current Discharge Medication List    START taking these medications   Details  naproxen (NAPROSYN) 500 MG tablet Take 1 tablet (500 mg total) by mouth 2 (two) times daily. Qty: 14 tablet, Refills: 0         Rise MuKenneth T Miyoko Hashimi, PA-C 12/05/15 [id number]    Nira ConnPedro Eduardo Cardama, MD 12/05/15 [phone]

## 2015-12-05 NOTE — ED Notes (Signed)
Bed: WA25 Expected date:  Expected time:  Means of arrival:  Comments: EMS fall 

## 2015-12-26 ENCOUNTER — Ambulatory Visit: Payer: Self-pay | Admitting: Family

## 2016-01-02 ENCOUNTER — Ambulatory Visit: Payer: Self-pay | Admitting: Family

## 2016-02-06 ENCOUNTER — Ambulatory Visit: Payer: PRIVATE HEALTH INSURANCE | Admitting: Family

## 2016-02-27 ENCOUNTER — Ambulatory Visit (INDEPENDENT_AMBULATORY_CARE_PROVIDER_SITE_OTHER): Payer: PRIVATE HEALTH INSURANCE | Admitting: Family

## 2016-02-27 ENCOUNTER — Encounter: Payer: Self-pay | Admitting: Family

## 2016-02-27 VITALS — BP 108/70 | HR 88 | Temp 98.1°F | Resp 16 | Ht 73.0 in | Wt 274.0 lb

## 2016-02-27 DIAGNOSIS — M170 Bilateral primary osteoarthritis of knee: Secondary | ICD-10-CM

## 2016-02-27 MED ORDER — NAPROXEN-ESOMEPRAZOLE 500-20 MG PO TBEC
1.0000 | DELAYED_RELEASE_TABLET | Freq: Two times a day (BID) | ORAL | 0 refills | Status: DC | PRN
Start: 2016-02-27 — End: 2016-06-17

## 2016-02-27 MED ORDER — DICLOFENAC SODIUM 2 % TD SOLN: 1.0000 "application " | Freq: Two times a day (BID) | TRANSDERMAL | 1 refills | Status: DC | PRN

## 2016-02-27 NOTE — Progress Notes (Signed)
Subjective:    Patient ID: Nicholas Howell, male    DOB: 07-31-1965, 51 y.o.   MRN: [id number]  Chief Complaint  Patient presents with  . Establish Care    having bad knee pain, swelling, when walking they give out     HPI:  Nicholas Howell is a 51 y.o. male who  has no past medical history on file. and presents today for an office visit to establish care.  This is a new problem. Associated symptom of pain located in his bilateral knees has been going on for about 7 years. Describes the pain as generalized aches with some sharpness on occasion. Has sensations of giving way at times with a frequency of every couple of days. No trauma or injury that he can recall. He has played football in the past. No previous history of knee injury. Modifying factors ice, heat, ibuprofen, Tylenol and Advil which have not really helped with pain. No numbness or tingling. No unexpected weight loss or fevers.     No Known Allergies    Outpatient Medications Prior to Visit  Medication Sig Dispense Refill  . ibuprofen (ADVIL,MOTRIN) 200 MG tablet Take 600 mg by mouth every 8 (eight) hours as needed for moderate pain.    . naproxen (NAPROSYN) 500 MG tablet Take 1 tablet (500 mg total) by mouth 2 (two) times daily. 14 tablet 0   No facility-administered medications prior to visit.      History reviewed. No pertinent past medical history.    Past Surgical History:  Procedure Laterality Date  . WRIST SURGERY        Family History  Problem Relation Age of Onset  . Diabetes Mother       Social History   Social History  . Marital status: Married    Spouse name: N/A  . Number of children: 2  . Years of education: 12   Occupational History  . [employer] doctor    Social History Main Topics  . Smoking status: Never Smoker  . Smokeless tobacco: Never Used  . Alcohol use Yes     Comment: Occasionally   . Drug use: No  . Sexual activity: Not on file   Other Topics Concern  . Not on file    Social History Narrative   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      Review of Systems  Constitutional: Negative for activity change, appetite change, chills, fatigue, fever and unexpected weight change.  Respiratory: Negative for chest tightness and shortness of breath.   Musculoskeletal:       Positive for bilateral knee pain.  Neurological: Negative for weakness and numbness.       Objective:    BP 108/70 (BP Location: Left Arm, Patient Position: Sitting, Cuff Size: Large)   Pulse 88   Temp 98.1 F (36.7 C) (Oral)   Resp 16   Ht 6\' 1"  (1.854 m)   Wt 274 lb (124.3 kg)   SpO2 96%   BMI 36.15 kg/m  Nursing note and vital signs reviewed.  Physical Exam  Constitutional: He is oriented to person, place, and time. He appears well-developed and well-nourished. No distress.  Cardiovascular: Normal rate, regular rhythm, normal heart sounds and intact distal pulses.   Pulmonary/Chest: Effort normal and breath sounds normal.  Musculoskeletal:  Bilateral knees - No obvious deformity, discoloration or edema. Tenderness noted of bilateral anterior joint lines with no crepitus or deformity. Range of motion and strength are within normal limits. Single leg squat with  rotary instability noted. Distal pulses and sensation are intact and appropriate. Ligamentous testing is negative. Some discomfort noted with meniscal testing.   Neurological: He is alert and oriented to person, place, and time.  Skin: Skin is warm and dry.  Psychiatric: He has a normal mood and affect. His behavior is normal. Judgment and thought content normal.        Assessment & Plan:   Problem List Items Addressed This Visit      Other   Primary osteoarthritis of both knees - Primary    Symptoms and exam consistent with primary osteoarthritis of bilateral knees with previous x-rays reviewed confirming diagnosis. He is obese with recommended weight loss. Treat conservatively with ice, home exercise therapy and start  Pennsaid and Vimovo. Fitted with knee braces. Follow up pending conservative treatment if symptoms worsen or do not improve with consideration for possible ultrasound or cortisone injections.       Relevant Medications   Naproxen-Esomeprazole 500-20 MG TBEC   Diclofenac Sodium (PENNSAID) 2 % SOLN       I have discontinued [name]'s ibuprofen and naproxen. I am also having him start on Naproxen-Esomeprazole and Diclofenac Sodium.   Meds ordered this encounter  Medications  . Naproxen-Esomeprazole 500-20 MG TBEC    Sig: Take 1 tablet by mouth 2 (two) times daily as needed.    Dispense:  60 tablet    Refill:  0    Order Specific Question:   Supervising Provider    Answer:   Hillard DankerRAWFORD, ELIZABETH A [[id number]]  . Diclofenac Sodium (PENNSAID) 2 % SOLN    Sig: Place 1 application onto the skin 2 (two) times daily as needed.    Dispense:  112 g    Refill:  1    Order Specific Question:   Supervising Provider    Answer:   Hillard DankerRAWFORD, ELIZABETH A [[id number]]     Follow-up: Return in about 1 month (around 03/26/2016), or if symptoms worsen or fail to improve.  Jeanine Luzalone, Gregory, FNP

## 2016-02-27 NOTE — Patient Instructions (Signed)
Thank you for choosing [facility].  SUMMARY AND INSTRUCTIONS:  Ice x 20 minutes every 2 hours and after activity / work.  Braces while at work and during activity.  Vimovo - 1 tablet by mouth 2x per day for 5-7 days and then as needed.  Pennsaid - 1/2 packet to each knee 2x per day.  Medication:  Your prescription(s) have been submitted to your pharmacy or been printed and provided for you. Please take as directed and contact our office if you believe you are having problem(s) with the medication(s) or have any questions.  Labs:  Please stop by the lab on the lower level of the building for your blood work. Your results will be released to MyChart (or called to you) after review, usually within 72 hours after test completion. If any changes need to be made, you will be notified at that same time.  1.) The lab is open from 7:30am to 5:30 pm Monday-Friday 2.) No appointment is necessary 3.) Fasting (if needed) is 6-8 hours after food and drink; black coffee and water are okay   Follow up:  If your symptoms worsen or fail to improve, please contact our office for further instruction, or in case of emergency go directly to the emergency room at the closest medical facility.     Osteoarthritis Osteoarthritis is a type of arthritis that affects tissue that covers the ends of bones in joints (cartilage). Cartilage acts as a cushion between the bones and helps them move smoothly. Osteoarthritis results when cartilage in the joints gets worn down. Osteoarthritis is sometimes called "wear and tear" arthritis. Osteoarthritis is the most common form of arthritis. It often occurs in older people. It is a condition that gets worse over time (a progressive condition). Joints that are most often affected by this condition are in:  Fingers.  Toes.  Hips.  Knees.  Spine, including neck and lower back. What are the causes? This condition is caused by age-related wearing down of  cartilage that covers the ends of bones. What increases the risk? The following factors may make you more likely to develop this condition:  Older age.  Being overweight or obese.  Overuse of joints, such as in athletes.  Past injury of a joint.  Past surgery on a joint.  Family history of osteoarthritis. What are the signs or symptoms? The main symptoms of this condition are pain, swelling, and stiffness in the joint. The joint may lose its shape over time. Small pieces of bone or cartilage may break off and float inside of the joint, which may cause more pain and damage to the joint. Small deposits of bone (osteophytes) may grow on the edges of the joint. Other symptoms may include:  A grating or scraping feeling inside the joint when you move it.  Popping or creaking sounds when you move. Symptoms may affect one or more joints. Osteoarthritis in a major joint, such as your knee or hip, can make it painful to walk or exercise. If you have osteoarthritis in your hands, you might not be able to grip items, twist your hand, or control small movements of your hands and fingers (fine motor skills). How is this diagnosed? This condition may be diagnosed based on:  Your medical history.  A physical exam.  Your symptoms.  X-rays of the affected joint(s).  Blood tests to rule out other types of arthritis. How is this treated? There is no cure for this condition, but treatment can help to  control pain and improve joint function. Treatment plans may include:  A prescribed exercise program that allows for rest and joint relief. You may work with a physical therapist.  A weight control plan.  Pain relief techniques, such as:  Applying heat and cold to the joint.  Electric pulses delivered to nerve endings under the skin (transcutaneous electrical nerve stimulation, or TENS).  Massage.  Certain nutritional supplements.  NSAIDs or prescription medicines to help relieve  pain.  Medicine to help relieve pain and inflammation (corticosteroids). This can be given by mouth (orally) or as an injection.  Assistive devices, such as a brace, wrap, splint, specialized glove, or cane.  Surgery, such as:  An osteotomy. This is done to reposition the bones and relieve pain or to remove loose pieces of bone and cartilage.  Joint replacement surgery. You may need this surgery if you have very bad (advanced) osteoarthritis. Follow these instructions at home: Activity  Rest your affected joints as directed by your health care provider.  Do not drive or use heavy machinery while taking prescription pain medicine.  Exercise as directed. Your health care provider or physical therapist may recommend specific types of exercise, such as:  Strengthening exercises. These are done to strengthen the muscles that support joints that are affected by arthritis. They can be performed with weights or with exercise bands to add resistance.  Aerobic activities. These are exercises, such as brisk walking or water aerobics, that get your heart pumping.  Range-of-motion activities. These keep your joints easy to move.  Balance and agility exercises. Managing pain, stiffness, and swelling  If directed, apply heat to the affected area as often as told by your health care provider. Use the heat source that your health care provider recommends, such as a moist heat pack or a heating pad.  If you have a removable assistive device, remove it as told by your health care provider.  Place a towel between your skin and the heat source. If your health care provider tells you to keep the assistive device on while you apply heat, place a towel between the assistive device and the heat source.  Leave the heat on for 20-30 minutes.  Remove the heat if your skin turns bright red. This is especially important if you are unable to feel pain, heat, or cold. You may have a greater risk of getting  burned.  If directed, put ice on the affected joint:  If you have a removable assistive device, remove it as told by your health care provider.  Put ice in a plastic bag.  Place a towel between your skin and the bag. If your health care provider tells you to keep the assistive device on during icing, place a towel between the assistive device and the bag.  Leave the ice on for 20 minutes, 2-3 times a day. General instructions  Take over-the-counter and prescription medicines only as told by your health care provider.  Maintain a healthy weight. Follow instructions from your health care provider for weight control. These may include dietary restrictions.  Do not use any products that contain nicotine or tobacco, such as cigarettes and e-cigarettes. These can delay bone healing. If you need help quitting, ask your health care provider.  Use assistive devices as directed by your health care provider.  Keep all follow-up visits as told by your health care provider. This is important. Where to find more information:  [employer] and Musculoskeletal and Skin Diseases: www.niams.http://www.myers.net/nih.gov  [employer] on Aging: https://walker.com/  American College of Rheumatology: www.rheumatology.org Contact a health care provider if:  Your skin turns red.  You develop a rash.  You have pain that gets worse.  You have a fever along with joint or muscle aches. Get help right away if:  You lose a lot of weight.  You suddenly lose your appetite.  You have night sweats. Summary  Osteoarthritis is a type of arthritis that affects tissue covering the ends of bones in joints (cartilage).  This condition is caused by age-related wearing down of cartilage that covers the ends of bones.  The main symptom of this condition is pain, swelling, and stiffness in the joint.  There is no cure for this condition, but treatment can help to control pain and improve joint  function. This information is not intended to replace advice given to you by your health care provider. Make sure you discuss any questions you have with your health care provider. Document Released: 01/04/2005 Document Revised: 09/08/2015 Document Reviewed: 09/08/2015 Elsevier Interactive Patient Education  2017 [employer]   Knee Exercises Ask your health care provider which exercises are safe for you. Do exercises exactly as told by your health care provider and adjust them as directed. It is normal to feel mild stretching, pulling, tightness, or discomfort as you do these exercises, but you should stop right away if you feel sudden pain or your pain gets worse.Do not begin these exercises until told by your health care provider. STRETCHING AND RANGE OF MOTION EXERCISES  These exercises warm up your muscles and joints and improve the movement and flexibility of your knee. These exercises also help to relieve pain, numbness, and tingling. Exercise A: Knee Extension, Prone 1. Lie on your abdomen on a bed. 2. Place your left / right knee just beyond the edge of the surface so your knee is not on the bed. You can put a towel under your left / right thigh just above your knee for comfort. 3. Relax your leg muscles and allow gravity to straighten your knee. You should feel a stretch behind your left / right knee. 4. Hold this position for __________ seconds. 5. Scoot up so your knee is supported between repetitions. Repeat __________ times. Complete this stretch __________ times a day. Exercise B: Knee Flexion, Active  1. Lie on your back with both knees straight. If this causes back discomfort, bend your left / right knee so your foot is flat on the floor. 2. Slowly slide your left / right heel back toward your buttocks until you feel a gentle stretch in the front of your knee or thigh. 3. Hold this position for __________ seconds. 4. Slowly slide your left / right heel back to the starting  position. Repeat __________ times. Complete this exercise __________ times a day. Exercise C: Quadriceps, Prone  1. Lie on your abdomen on a firm surface, such as a bed or padded floor. 2. Bend your left / right knee and hold your ankle. If you cannot reach your ankle or pant leg, loop a belt around your foot and grab the belt instead. 3. Gently pull your heel toward your buttocks. Your knee should not slide out to the side. You should feel a stretch in the front of your thigh and knee. 4. Hold this position for __________ seconds. Repeat __________ times. Complete this stretch __________ times a day. Exercise D: Hamstring, Supine 1. Lie on your back. 2. Loop a belt or towel over  the ball of your left / right foot. The ball of your foot is on the walking surface, right under your toes. 3. Straighten your left / right knee and slowly pull on the belt to raise your leg until you feel a gentle stretch behind your knee.  Do not let your left / right knee bend while you do this.  Keep your other leg flat on the floor. 4. Hold this position for __________ seconds. Repeat __________ times. Complete this stretch __________ times a day. STRENGTHENING EXERCISES  These exercises build strength and endurance in your knee. Endurance is the ability to use your muscles for a long time, even after they get tired. Exercise E: Quadriceps, Isometric  1. Lie on your back with your left / right leg extended and your other knee bent. Put a rolled towel or small pillow under your knee if told by your health care provider. 2. Slowly tense the muscles in the front of your left / right thigh. You should see your kneecap slide up toward your hip or see increased dimpling just above the knee. This motion will push the back of the knee toward the floor. 3. For __________ seconds, keep the muscle as tight as you can without increasing your pain. 4. Relax the muscles slowly and completely. Repeat __________ times.  Complete this exercise __________ times a day. Exercise F: Straight Leg Raises - Quadriceps 1. Lie on your back with your left / right leg extended and your other knee bent. 2. Tense the muscles in the front of your left / right thigh. You should see your kneecap slide up or see increased dimpling just above the knee. Your thigh may even shake a bit. 3. Keep these muscles tight as you raise your leg 4-6 inches (10-15 cm) off the floor. Do not let your knee bend. 4. Hold this position for __________ seconds. 5. Keep these muscles tense as you lower your leg. 6. Relax your muscles slowly and completely after each repetition. Repeat __________ times. Complete this exercise __________ times a day. Exercise G: Hamstring, Isometric 1. Lie on your back on a firm surface. 2. Bend your left / right knee approximately __________ degrees. 3. Dig your left / right heel into the surface as if you are trying to pull it toward your buttocks. Tighten the muscles in the back of your thighs to dig as hard as you can without increasing any pain. 4. Hold this position for __________ seconds. 5. Release the tension gradually and allow your muscles to relax completely for __________ seconds after each repetition. Repeat __________ times. Complete this exercise __________ times a day. Exercise H: Hamstring Curls  If told by your health care provider, do this exercise while wearing ankle weights. Begin with __________ weights. Then increase the weight by 1 lb (0.5 kg) increments. Do not wear ankle weights that are more than __________. 1. Lie on your abdomen with your legs straight. 2. Bend your left / right knee as far as you can without feeling pain. Keep your hips flat against the floor. 3. Hold this position for __________ seconds. 4. Slowly lower your leg to the starting position. Repeat __________ times. Complete this exercise __________ times a day. Exercise I: Squats (Quadriceps) 1. Stand in front of a  table, with your feet and knees pointing straight ahead. You may rest your hands on the table for balance but not for support. 2. Slowly bend your knees and lower your hips like you are going to  sit in a chair.  Keep your weight over your heels, not over your toes.  Keep your lower legs upright so they are parallel with the table legs.  Do not let your hips go lower than your knees.  Do not bend lower than told by your health care provider.  If your knee pain increases, do not bend as low. 3. Hold the squat position for __________ seconds. 4. Slowly push with your legs to return to standing. Do not use your hands to pull yourself to standing. Repeat __________ times. Complete this exercise __________ times a day. Exercise J: Wall Slides (Quadriceps)  1. Lean your back against a smooth wall or door while you walk your feet out 18-24 inches (46-61 cm) from it. 2. Place your feet hip-width apart. 3. Slowly slide down the wall or door until your knees bend __________ degrees. Keep your knees over your heels, not over your toes. Keep your knees in line with your hips. 4. Hold for __________ seconds. Repeat __________ times. Complete this exercise __________ times a day. Exercise K: Straight Leg Raises - Hip Abductors 1. Lie on your side with your left / right leg in the top position. Lie so your head, shoulder, knee, and hip line up. You may bend your bottom knee to help you keep your balance. 2. Roll your hips slightly forward so your hips are stacked directly over each other and your left / right knee is facing forward. 3. Leading with your heel, lift your top leg 4-6 inches (10-15 cm). You should feel the muscles in your outer hip lifting.  Do not let your foot drift forward.  Do not let your knee roll toward the ceiling. 4. Hold this position for __________ seconds. 5. Slowly return your leg to the starting position. 6. Let your muscles relax completely after each repetition. Repeat  __________ times. Complete this exercise __________ times a day. Exercise L: Straight Leg Raises - Hip Extensors 1. Lie on your abdomen on a firm surface. You can put a pillow under your hips if that is more comfortable. 2. Tense the muscles in your buttocks and lift your left / right leg about 4-6 inches (10-15 cm). Keep your knee straight as you lift your leg. 3. Hold this position for __________ seconds. 4. Slowly lower your leg to the starting position. 5. Let your leg relax completely after each repetition. Repeat __________ times. Complete this exercise __________ times a day. This information is not intended to replace advice given to you by your health care provider. Make sure you discuss any questions you have with your health care provider. Document Released: 11/18/2004 Document Revised: 09/29/2015 Document Reviewed: 11/10/2014 Elsevier Interactive Patient Education  2017 [employer].

## 2016-02-27 NOTE — Assessment & Plan Note (Addendum)
Symptoms and exam consistent with primary osteoarthritis of bilateral knees with previous x-rays reviewed confirming diagnosis. He is obese with recommended weight loss. Treat conservatively with ice, home exercise therapy and start Pennsaid and Vimovo. Fitted with knee braces. Follow up pending conservative treatment if symptoms worsen or do not improve with consideration for possible ultrasound or cortisone injections.

## 2016-05-20 ENCOUNTER — Encounter: Payer: PRIVATE HEALTH INSURANCE | Admitting: Family

## 2016-06-17 ENCOUNTER — Ambulatory Visit (INDEPENDENT_AMBULATORY_CARE_PROVIDER_SITE_OTHER): Payer: PRIVATE HEALTH INSURANCE | Admitting: Family

## 2016-06-17 ENCOUNTER — Encounter: Payer: Self-pay | Admitting: Family

## 2016-06-17 VITALS — BP 114/78 | HR 71 | Temp 98.1°F | Resp 16 | Ht 73.0 in | Wt 274.1 lb

## 2016-06-17 DIAGNOSIS — Z0000 Encounter for general adult medical examination without abnormal findings: Secondary | ICD-10-CM | POA: Diagnosis not present

## 2016-06-17 DIAGNOSIS — Z1211 Encounter for screening for malignant neoplasm of colon: Secondary | ICD-10-CM | POA: Diagnosis not present

## 2016-06-17 DIAGNOSIS — Z6836 Body mass index (BMI) 36.0-36.9, adult: Secondary | ICD-10-CM

## 2016-06-17 DIAGNOSIS — E6609 Other obesity due to excess calories: Secondary | ICD-10-CM

## 2016-06-17 NOTE — Progress Notes (Signed)
Subjective:    Patient ID: Nicholas Howell, male    DOB: [birth year], 51 y.o.   MRN: [id number]  Chief Complaint  Patient presents with  . CPE    not fasting    HPI:  Nicholas Howell is a 51 y.o. male who presents today for an annual wellness visit.   1) Health Maintenance -   Diet - Averages about 2-3 meals per day consisting of a regular diet; Caffeine intake of about 2-3 cups per day.  Exercise - No structured exercise. Walks at work.    2) Preventative Exams / Immunizations:  Dental -- Due for exam   Vision -- Due for exam    Health Maintenance  Topic Date Due  . HIV Screening  12/26/1979  . COLONOSCOPY  12/26/2014  . TETANUS/TDAP  06/20/2017 (Originally 12/26/1983)  . INFLUENZA VACCINE  08/18/2016     There is no immunization history on file for this patient.   No Known Allergies   Outpatient Medications Prior to Visit  Medication Sig Dispense Refill  . Diclofenac Sodium (PENNSAID) 2 % SOLN Place 1 application onto the skin 2 (two) times daily as needed. (Patient not taking: Reported on 06/17/2016) 112 g 1  . Naproxen-Esomeprazole 500-20 MG TBEC Take 1 tablet by mouth 2 (two) times daily as needed. (Patient not taking: Reported on 06/17/2016) 60 tablet 0   No facility-administered medications prior to visit.      History reviewed. No pertinent past medical history.   Past Surgical History:  Procedure Laterality Date  . WRIST SURGERY       Family History  Problem Relation Age of Onset  . Diabetes Mother      Social History   Social History  . Marital status: Married    Spouse name: N/A  . Number of children: 2  . Years of education: 12   Occupational History  . [employer] doctor    Social History Main Topics  . Smoking status: Never Smoker  . Smokeless tobacco: Never Used  . Alcohol use No  . Drug use: No  . Sexual activity: Not on file   Other Topics Concern  . 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       Review of Systems  Constitutional: Denies fever, chills, fatigue, or significant weight gain/loss. HENT: Head: Denies headache or neck pain Ears: Denies changes in hearing, ringing in ears, earache, drainage Nose: Denies discharge, stuffiness, itching, nosebleed, sinus pain Throat: Denies sore throat, hoarseness, dry mouth, sores, thrush Eyes: Denies loss/changes in vision, pain, redness, blurry/double vision, flashing lights Cardiovascular: Denies chest pain/discomfort, tightness, palpitations, shortness of breath with activity, difficulty lying down, swelling, sudden awakening with shortness of breath Respiratory: Denies shortness of breath, cough, sputum production, wheezing Gastrointestinal: Denies dysphasia, heartburn, change in appetite, nausea, change in bowel habits, rectal bleeding, constipation, diarrhea, yellow skin or eyes Genitourinary: Denies frequency, urgency, burning/pain, blood in urine, incontinence, change in urinary strength. Musculoskeletal: Denies muscle/joint pain, stiffness, back pain, redness or swelling of joints, trauma Skin: Denies rashes, lumps, itching, dryness, color changes, or hair/nail changes Neurological: Denies dizziness, fainting, seizures, weakness, numbness, tingling, tremor Psychiatric - Denies nervousness, stress, depression or memory loss Endocrine: Denies heat or cold intolerance, sweating, frequent urination, excessive thirst, changes in appetite Hematologic: Denies ease of bruising or bleeding     Objective:     BP 114/78 (BP Location: Left Arm, Patient Position: Sitting, Cuff Size: Large)   Pulse 71  Temp 98.1 F (36.7 C) (Oral)   Resp 16   Ht 6\' 1"  (1.854 m)   Wt 274 lb 1.9 oz (124.3 kg)   SpO2 96%   BMI 36.17 kg/m  Nursing note and vital signs reviewed.  Physical Exam  Constitutional: He is oriented to person, place, and time. He appears well-developed and well-nourished.  HENT:  Head: Normocephalic.  Right Ear:  Hearing, tympanic membrane, external ear and ear canal normal.  Left Ear: Hearing, tympanic membrane, external ear and ear canal normal.  Nose: Nose normal.  Mouth/Throat: Uvula is midline, oropharynx is clear and moist and mucous membranes are normal.  Eyes: Conjunctivae and EOM are normal. Pupils are equal, round, and reactive to light.  Neck: Neck supple. No JVD present. No tracheal deviation present. No thyromegaly present.  Cardiovascular: Normal rate, regular rhythm, normal heart sounds and intact distal pulses.   Pulmonary/Chest: Effort normal and breath sounds normal.  Abdominal: Soft. Bowel sounds are normal. He exhibits no distension and no mass. There is no tenderness. There is no rebound and no guarding.  Musculoskeletal: Normal range of motion. He exhibits no edema or tenderness.  Lymphadenopathy:    He has no cervical adenopathy.  Neurological: He is alert and oriented to person, place, and time. He has normal reflexes. No cranial nerve deficit. He exhibits normal muscle tone. Coordination normal.  Skin: Skin is warm and dry.  Psychiatric: He has a normal mood and affect. His behavior is normal. Judgment and thought content normal.       Assessment & Plan:   Problem List Items Addressed This Visit      Other   Routine adult health maintenance - Primary    1) Anticipatory Guidance: Discussed importance of wearing a seatbelt while driving and not texting while driving; changing batteries in smoke detector at least once annually; wearing suntan lotion when outside; eating a balanced and moderate diet; getting physical activity at least 30 minutes per day.  2) Immunizations / Screenings / Labs:  Declines tetanus. All other immunizations are up-to-date per recommendations. Due for a dental and vision exam encouraged to be completed independently. Obtain PSA for prostate cancer screening. Due for colon cancer screening with referral to gastroenterology placed. All other screenings  are up-to-date per recommendations. Obtain CBC, CMET, and lipid profile.    Overall well exam with risk factors for cardiovascular disease including obesity. Recommend weight loss of 5-10% of current body weight through nutrition and physical activity. Discussed importance of eating a nutritional intake that is balanced, moderate, and varied. Increase physical activity to goal of 30 minutes of moderate level activity to 3 times per week or approximately 10,000 steps per day. Continue other healthy lifestyle behaviors and choices. Follow-up prevention exam in 1 year. Follow-up office visit pending blood work as needed.       Relevant Orders   CBC   Comprehensive metabolic panel   Lipid panel   PSA   Class 2 obesity due to excess calories without serious comorbidity with body mass index (BMI) of 36.0 to 36.9 in adult    BMI of 36. Recommend weight loss of 5-10% of current body weight. Recommend increasing physical activity to 30 minutes of moderate level activity daily. Encourage nutritional intake that focuses on nutrient dense foods and is moderate, varied, and balanced and is low in saturated fats and processed/sugary foods. Continue to monitor.        Other Visit Diagnoses    Colon cancer screening  Relevant Orders   Ambulatory referral to Gastroenterology       I have discontinued Mr. Pounders Naproxen-Esomeprazole and Diclofenac Sodium.   Follow-up: Return if symptoms worsen or fail to improve.   Jeanine Luz, FNP

## 2016-06-17 NOTE — Assessment & Plan Note (Signed)
1) Anticipatory Guidance: Discussed importance of wearing a seatbelt while driving and not texting while driving; changing batteries in smoke detector at least once annually; wearing suntan lotion when outside; eating a balanced and moderate diet; getting physical activity at least 30 minutes per day.  2) Immunizations / Screenings / Labs:  Declines tetanus. All other immunizations are up-to-date per recommendations. Due for a dental and vision exam encouraged to be completed independently. Obtain PSA for prostate cancer screening. Due for colon cancer screening with referral to gastroenterology placed. All other screenings are up-to-date per recommendations. Obtain CBC, CMET, and lipid profile.    Overall well exam with risk factors for cardiovascular disease including obesity. Recommend weight loss of 5-10% of current body weight through nutrition and physical activity. Discussed importance of eating a nutritional intake that is balanced, moderate, and varied. Increase physical activity to goal of 30 minutes of moderate level activity to 3 times per week or approximately 10,000 steps per day. Continue other healthy lifestyle behaviors and choices. Follow-up prevention exam in 1 year. Follow-up office visit pending blood work as needed.

## 2016-06-17 NOTE — Patient Instructions (Signed)
Thank you for choosing [facility].  SUMMARY AND INSTRUCTIONS:  Recommend weight loss of 5-10% of current body weight through nutrition and physical activity.  Emphasize fruits, vegetables and legumes. A mediterranian type intake is below.  Aim for higher protein and lower carbohydrates.   Labs:  Please stop by the lab on the lower level of the building for your blood work. Your results will be released to MyChart (or called to you) after review, usually within 72 hours after test completion. If any changes need to be made, you will be notified at that same time.  1.) The lab is open from 7:30am to 5:30 pm Monday-Friday 2.) No appointment is necessary 3.) Fasting (if needed) is 6-8 hours after food and drink; black coffee and water are okay   Follow up:  If your symptoms worsen or fail to improve, please contact our office for further instruction, or in case of emergency go directly to the emergency room at the closest medical facility.     Health Maintenance, Male A healthy lifestyle and preventive care is important for your health and wellness. Ask your health care provider about what schedule of regular examinations is right for you. What should I know about weight and diet? Eat a Healthy Diet  Eat plenty of vegetables, fruits, whole grains, low-fat dairy products, and lean protein.  Do not eat a lot of foods high in solid fats, added sugars, or salt.  Maintain a Healthy Weight Regular exercise can help you achieve or maintain a healthy weight. You should:  Do at least 150 minutes of exercise each week. The exercise should increase your heart rate and make you sweat (moderate-intensity exercise).  Do strength-training exercises at least twice a week.  Watch Your Levels of Cholesterol and Blood Lipids  Have your blood tested for lipids and cholesterol every 5 years starting at 52 years of age. If you are at high risk for heart disease, you should start having your  blood tested when you are 52 years old. You may need to have your cholesterol levels checked more often if: ? Your lipid or cholesterol levels are high. ? You are older than 52 years of age. ? You are at high risk for heart disease.  What should I know about cancer screening? Many types of cancers can be detected early and may often be prevented. Lung Cancer  You should be screened every year for lung cancer if: ? You are a current smoker who has smoked for at least 30 years. ? You are a former smoker who has quit within the past 15 years.  Talk to your health care provider about your screening options, when you should start screening, and how often you should be screened.  Colorectal Cancer  Routine colorectal cancer screening usually begins at 52 years of age and should be repeated every 5-10 years until you are 52 years old. You may need to be screened more often if early forms of precancerous polyps or small growths are found. Your health care provider may recommend screening at an earlier age if you have risk factors for colon cancer.  Your health care provider may recommend using home test kits to check for hidden blood in the stool.  A small camera at the end of a tube can be used to examine your colon (sigmoidoscopy or colonoscopy). This checks for the earliest forms of colorectal cancer.  Prostate and Testicular Cancer  Depending on your age and overall health, your health care  provider may do certain tests to screen for prostate and testicular cancer.  Talk to your health care provider about any symptoms or concerns you have about testicular or prostate cancer.  Skin Cancer  Check your skin from head to toe regularly.  Tell your health care provider about any new moles or changes in moles, especially if: ? There is a change in a mole's size, shape, or color. ? You have a mole that is larger than a pencil eraser.  Always use sunscreen. Apply sunscreen liberally and  repeat throughout the day.  Protect yourself by wearing long sleeves, pants, a wide-brimmed hat, and sunglasses when outside.  What should I know about heart disease, diabetes, and high blood pressure?  If you are 46-36 years of age, have your blood pressure checked every 3-5 years. If you are 33 years of age or older, have your blood pressure checked every year. You should have your blood pressure measured twice-once when you are at a hospital or clinic, and once when you are not at a hospital or clinic. Record the average of the two measurements. To check your blood pressure when you are not at a hospital or clinic, you can use: ? An automated blood pressure machine at a pharmacy. ? A home blood pressure monitor.  Talk to your health care provider about your target blood pressure.  If you are between 65-51 years old, ask your health care provider if you should take aspirin to prevent heart disease.  Have regular diabetes screenings by checking your fasting blood sugar level. ? If you are at a normal weight and have a low risk for diabetes, have this test once every three years after the age of 103. ? If you are overweight and have a high risk for diabetes, consider being tested at a younger age or more often.  A one-time screening for abdominal aortic aneurysm (AAA) by ultrasound is recommended for men aged 65-75 years who are current or former smokers. What should I know about preventing infection? Hepatitis B If you have a higher risk for hepatitis B, you should be screened for this virus. Talk with your health care provider to find out if you are at risk for hepatitis B infection. Hepatitis C Blood testing is recommended for:  Everyone born from [birth year] through 1966.  Anyone with known risk factors for hepatitis C.  Sexually Transmitted Diseases (STDs)  You should be screened each year for STDs including gonorrhea and chlamydia if: ? You are sexually active and are younger than 52  years of age. ? You are older than 52 years of age and your health care provider tells you that you are at risk for this type of infection. ? Your sexual activity has changed since you were last screened and you are at an increased risk for chlamydia or gonorrhea. Ask your health care provider if you are at risk.  Talk with your health care provider about whether you are at high risk of being infected with HIV. Your health care provider may recommend a prescription medicine to help prevent HIV infection.  What else can I do?  Schedule regular health, dental, and eye exams.  Stay current with your vaccines (immunizations).  Do not use any tobacco products, such as cigarettes, chewing tobacco, and e-cigarettes. If you need help quitting, ask your health care provider.  Limit alcohol intake to no more than 2 drinks per day. One drink equals 12 ounces of beer, 5 ounces of wine, or  1 ounces of hard liquor.  Do not use street drugs.  Do not share needles.  Ask your health care provider for help if you need support or information about quitting drugs.  Tell your health care provider if you often feel depressed.  Tell your health care provider if you have ever been abused or do not feel safe at home. This information is not intended to replace advice given to you by your health care provider. Make sure you discuss any questions you have with your health care provider. Document Released: 07/03/2007 Document Revised: 09/03/2015 Document Reviewed: 10/08/2014 Elsevier Interactive Patient Education  2018 [employer]     Why follow it? Research shows. . Those who follow the Mediterranean diet have a reduced risk of heart disease  . The diet is associated with a reduced incidence of Parkinson's and Alzheimer's diseases . People following the diet may have longer life expectancies and lower rates of chronic diseases  . The Dietary Guidelines for Americans recommends the Mediterranean diet as an  eating plan to promote health and prevent disease  What Is the Mediterranean Diet?  . Healthy eating plan based on typical foods and recipes of Mediterranean-style cooking . The diet is primarily a plant based diet; these foods should make up a majority of meals   Starches - Plant based foods should make up a majority of meals - They are an important sources of vitamins, minerals, energy, antioxidants, and fiber - Choose whole grains, foods high in fiber and minimally processed items  - Typical grain sources include wheat, oats, barley, corn, brown rice, bulgar, farro, millet, polenta, couscous  - Various types of beans include chickpeas, lentils, fava beans, black beans, white beans   Fruits  Veggies - Large quantities of antioxidant rich fruits & veggies; 6 or more servings  - Vegetables can be eaten raw or lightly drizzled with oil and cooked  - Vegetables common to the traditional Mediterranean Diet include: artichokes, arugula, beets, broccoli, brussel sprouts, cabbage, carrots, celery, collard greens, cucumbers, eggplant, kale, leeks, lemons, lettuce, mushrooms, okra, onions, peas, peppers, potatoes, pumpkin, radishes, rutabaga, shallots, spinach, sweet potatoes, turnips, zucchini - Fruits common to the Mediterranean Diet include: apples, apricots, avocados, cherries, clementines, dates, figs, grapefruits, grapes, melons, nectarines, oranges, peaches, pears, pomegranates, strawberries, tangerines  Fats - Replace butter and margarine with healthy oils, such as olive oil, canola oil, and tahini  - Limit nuts to no more than a handful a day  - Nuts include walnuts, almonds, pecans, pistachios, pine nuts  - Limit or avoid candied, honey roasted or heavily salted nuts - Olives are central to the [employer]iterranean diet - can be eaten whole or used in a variety of dishes   Meats Protein - Limiting red meat: no more than a few times a month - When eating red meat: choose lean cuts and keep the portion  to the size of deck of cards - Eggs: approx. 0 to 4 times a week  - Fish and lean poultry: at least 2 a week  - Healthy protein sources include, chicken, [place], lean beef, lamb - Increase intake of seafood such as tuna, salmon, trout, mackerel, shrimp, scallops - Avoid or limit high fat processed meats such as sausage and bacon  Dairy - Include moderate amounts of low fat dairy products  - Focus on healthy dairy such as fat free yogurt, skim milk, low or reduced fat cheese - Limit dairy products higher in fat such as whole or 2% milk,  cheese, ice cream  Alcohol - Moderate amounts of red wine is ok  - No more than 5 oz daily for women (all ages) and men older than age 43  - No more than 10 oz of wine daily for men younger than 49  Other - Limit sweets and other desserts  - Use herbs and spices instead of salt to flavor foods  - Herbs and spices common to the traditional Mediterranean Diet include: basil, bay leaves, chives, cloves, cumin, fennel, garlic, lavender, marjoram, mint, oregano, parsley, pepper, rosemary, sage, savory, sumac, tarragon, thyme   It's not just a diet, it's a lifestyle:  . The Mediterranean diet includes lifestyle factors typical of those in the region  . Foods, drinks and meals are best eaten with others and savored . Daily physical activity is important for overall good health . This could be strenuous exercise like running and aerobics . This could also be more leisurely activities such as walking, housework, yard-work, or taking the stairs . Moderation is the key; a balanced and healthy diet accommodates most foods and drinks . Consider portion sizes and frequency of consumption of certain foods   Meal Ideas & Options:  . Breakfast:  o Whole wheat toast or whole wheat English muffins with peanut butter & hard boiled egg o Steel cut oats topped with apples & cinnamon and skim milk  o Fresh fruit: banana, strawberries, melon, berries, peaches  o Smoothies:  strawberries, bananas, greek yogurt, peanut butter o Low fat greek yogurt with blueberries and granola  o Egg white omelet with spinach and mushrooms o Breakfast couscous: whole wheat couscous, apricots, skim milk, cranberries  . Sandwiches:  o Hummus and grilled vegetables (peppers, zucchini, squash) on whole wheat bread   o Grilled chicken on whole wheat pita with lettuce, tomatoes, cucumbers or tzatziki  o Tuna salad on whole wheat bread: tuna salad made with greek yogurt, olives, red peppers, capers, green onions o Garlic rosemary lamb pita: lamb sauted with garlic, rosemary, salt & pepper; add lettuce, cucumber, greek yogurt to pita - flavor with lemon juice and black pepper  . Seafood:  o Mediterranean grilled salmon, seasoned with garlic, basil, parsley, lemon juice and black pepper o Shrimp, lemon, and spinach whole-grain pasta salad made with low fat greek yogurt  o Seared scallops with lemon orzo  o Seared tuna steaks seasoned salt, pepper, coriander topped with tomato mixture of olives, tomatoes, olive oil, minced garlic, parsley, green onions and cappers  . Meats:  o Herbed greek chicken salad with kalamata olives, cucumber, feta  o Red bell peppers stuffed with spinach, bulgur, lean ground beef (or lentils) & topped with feta   o Kebabs: skewers of chicken, tomatoes, onions, zucchini, squash  o [place] burgers: made with red onions, mint, dill, lemon juice, feta cheese topped with roasted red peppers . Vegetarian o Cucumber salad: cucumbers, artichoke hearts, celery, red onion, feta cheese, tossed in olive oil & lemon juice  o Hummus and whole grain pita points with a greek salad (lettuce, tomato, feta, olives, cucumbers, red onion) o Lentil soup with celery, carrots made with vegetable broth, garlic, salt and pepper  o Tabouli salad: parsley, bulgur, mint, scallions, cucumbers, tomato, radishes, lemon juice, olive oil, salt and pepper.

## 2016-06-17 NOTE — Assessment & Plan Note (Signed)
BMI of 36. Recommend weight loss of 5-10% of current body weight. Recommend increasing physical activity to 30 minutes of moderate level activity daily. Encourage nutritional intake that focuses on nutrient dense foods and is moderate, varied, and balanced and is low in saturated fats and processed/sugary foods. Continue to monitor.   

## 2016-07-01 ENCOUNTER — Encounter: Payer: Self-pay | Admitting: Internal Medicine

## 2016-08-04 IMAGING — CR DG CHEST 2V
2 series · 2 of 2 positions shown · non-contrast
Comparison: 05/16/2005

CLINICAL DATA: Mid left chest pain worsening since yesterday.
Shortness of breath.

EXAM:
CHEST  2 VIEW

[w chest pa]
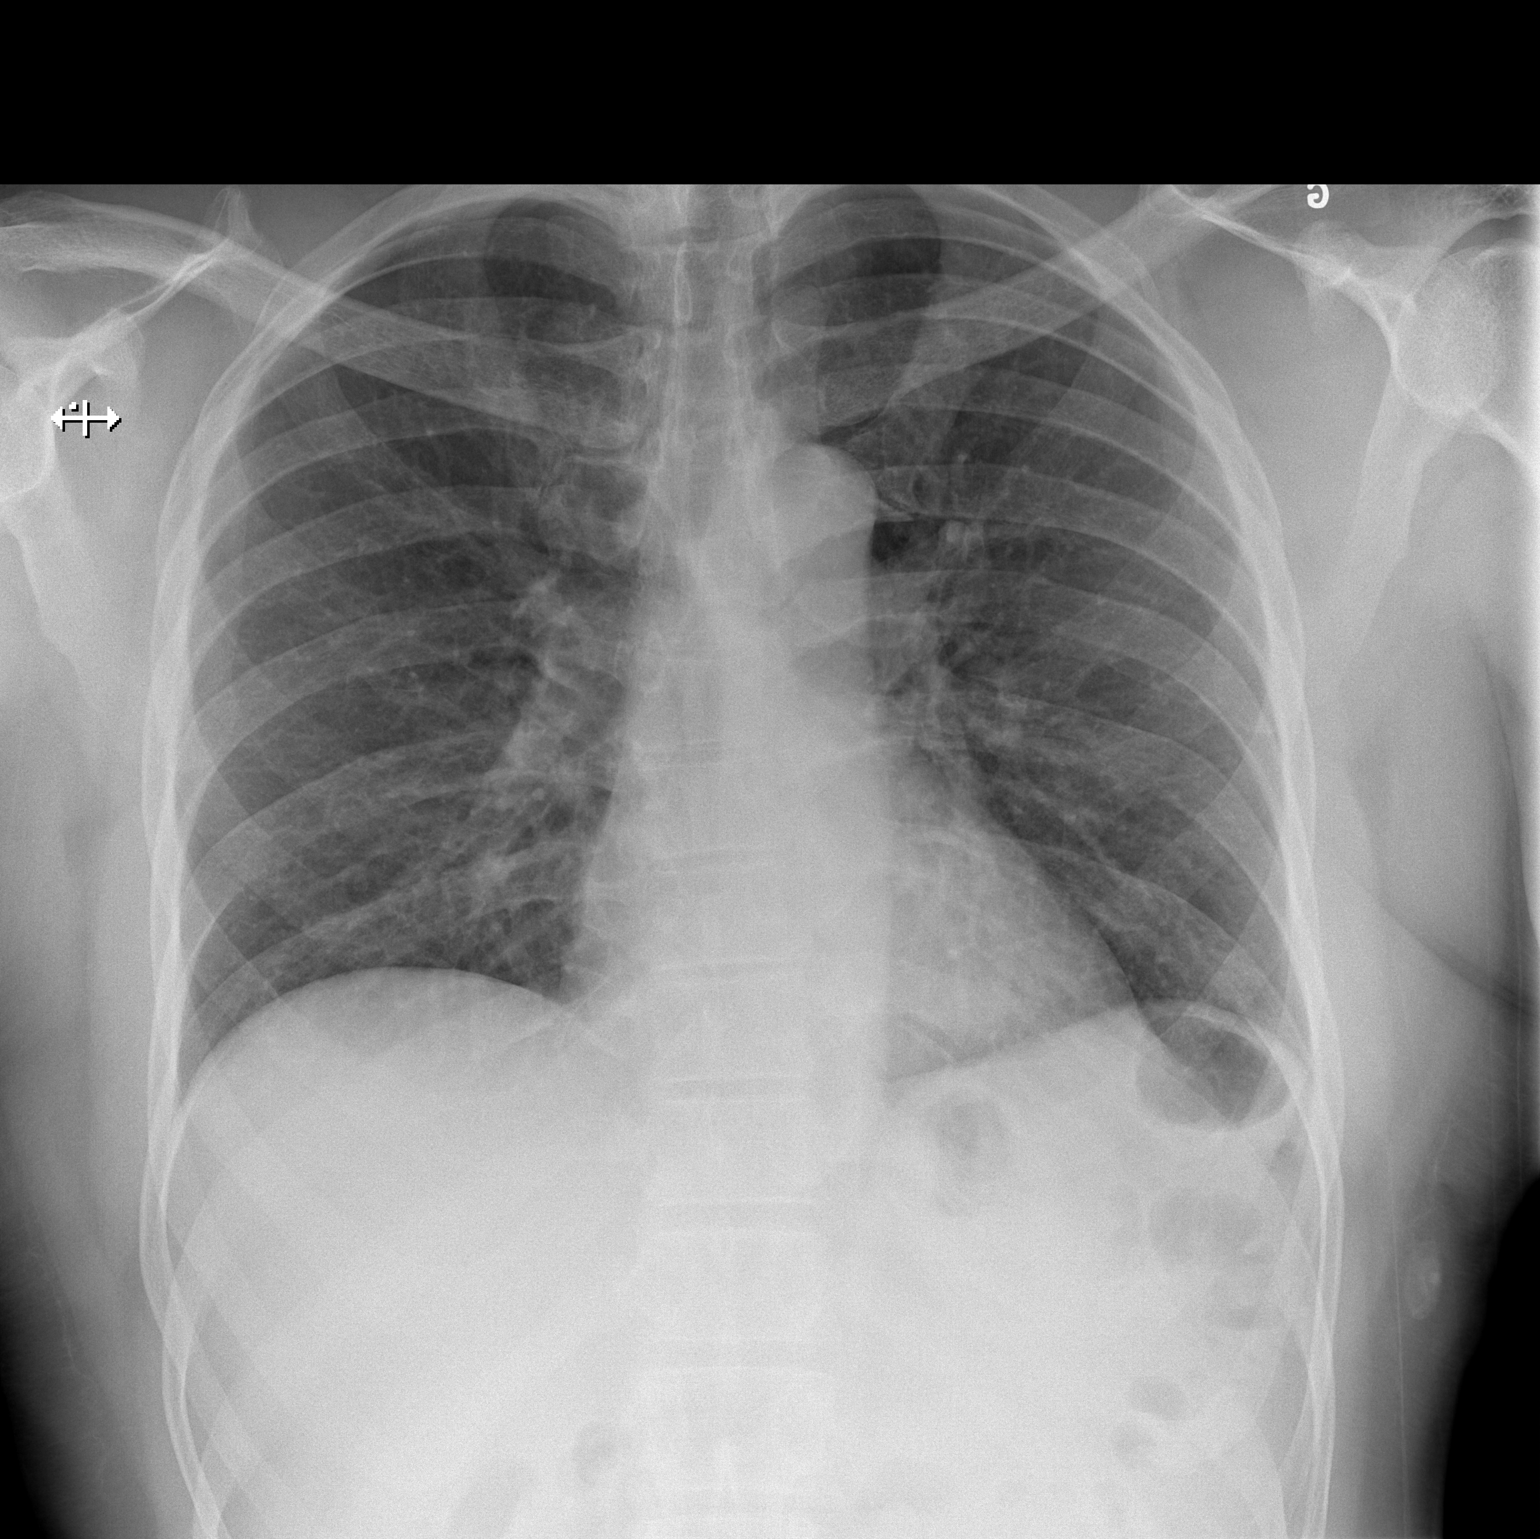

[w chest lat]
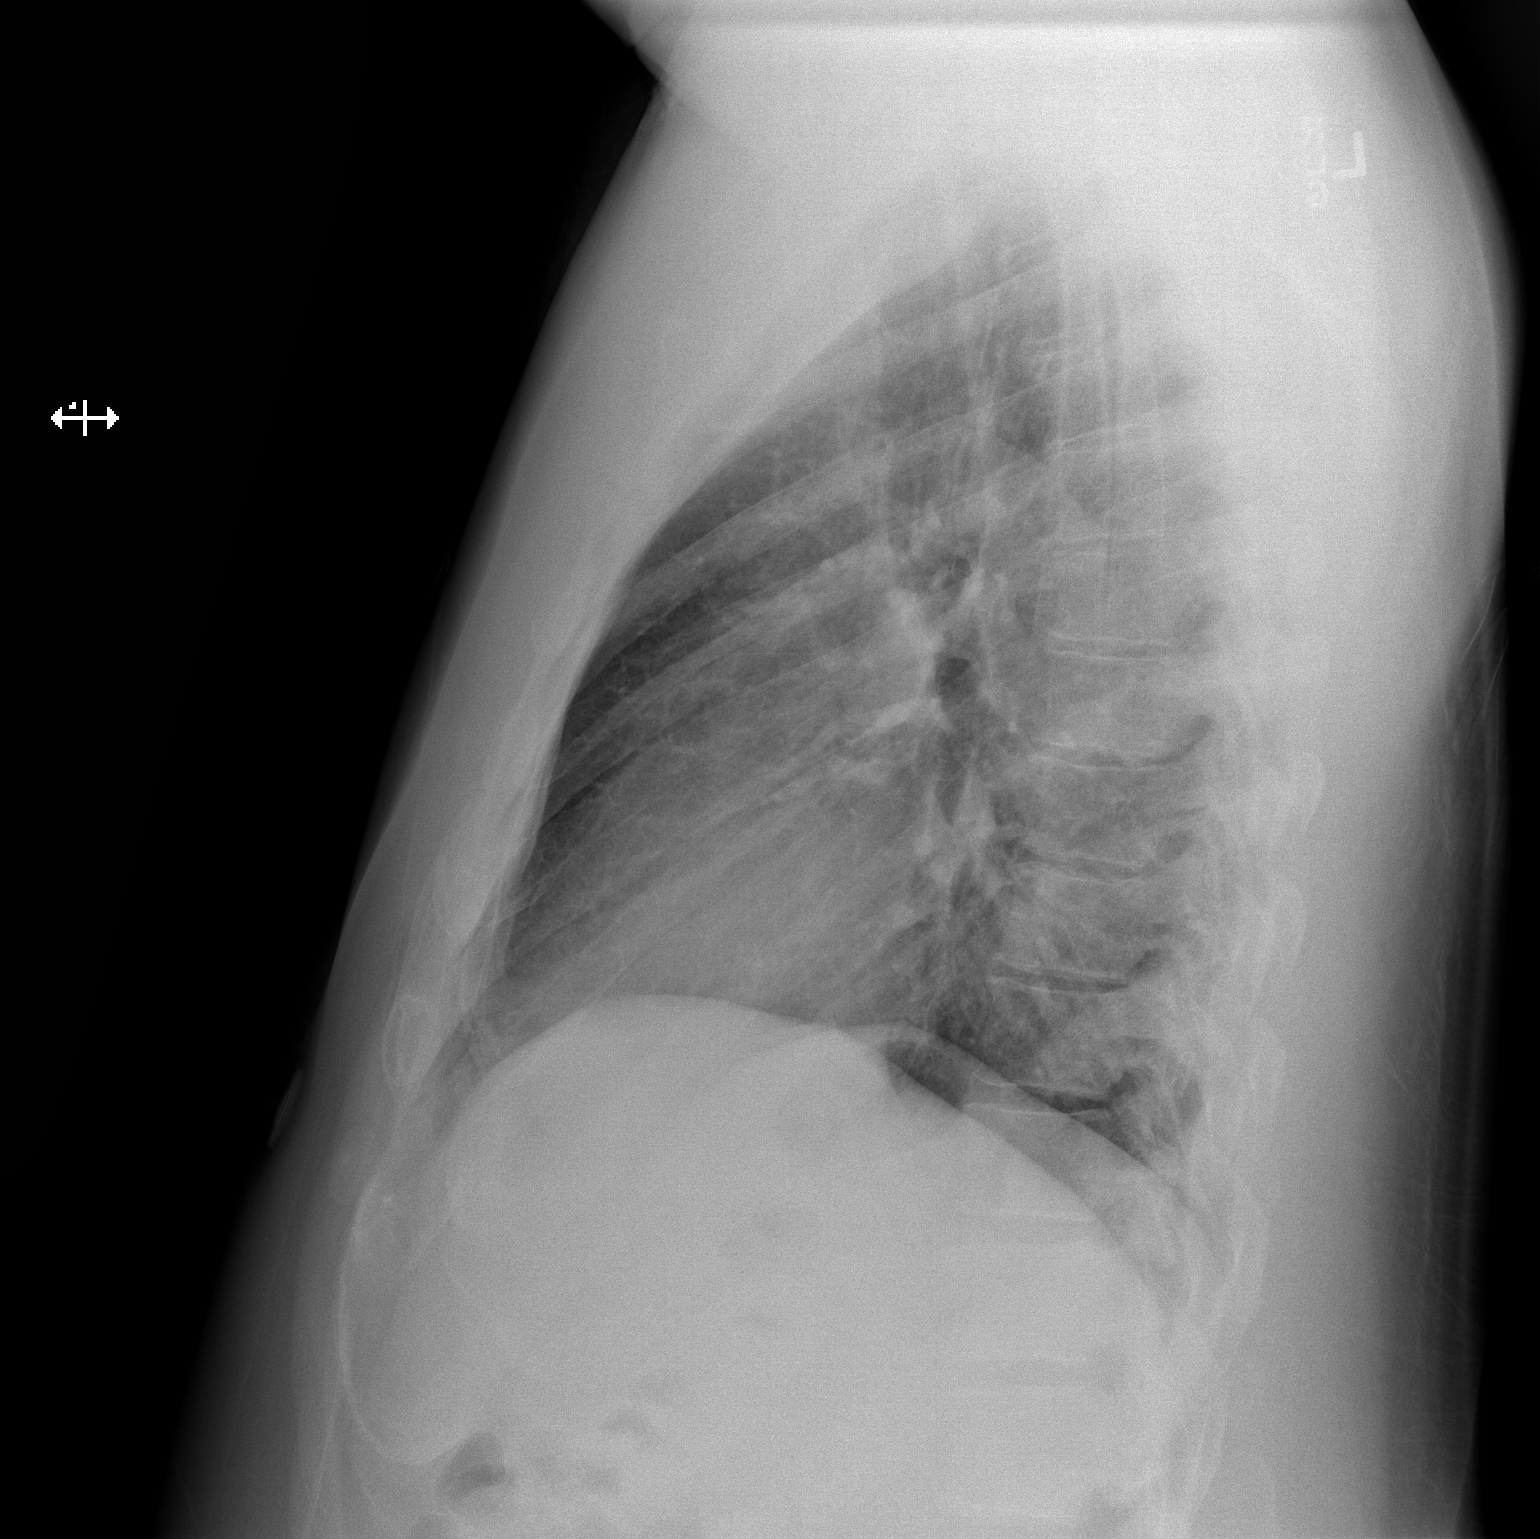

[2 of 2 positions shown; findings below may reference images not displayed]

FINDINGS: The heart size and mediastinal contours are within normal limits.
Both lungs are clear. The visualized skeletal structures are
unremarkable.
IMPRESSION: No active cardiopulmonary disease.

## 2016-08-12 ENCOUNTER — Emergency Department (HOSPITAL_COMMUNITY)
Admission: EM | Admit: 2016-08-12 | Discharge: 2016-08-12 | Disposition: A | Discharge status: Home / Self Care | Payer: PRIVATE HEALTH INSURANCE | Attending: Emergency Medicine | Admitting: Emergency Medicine

## 2016-08-12 ENCOUNTER — Encounter (HOSPITAL_COMMUNITY): Payer: Self-pay

## 2016-08-12 DIAGNOSIS — M170 Bilateral primary osteoarthritis of knee: Secondary | ICD-10-CM | POA: Diagnosis not present

## 2016-08-12 DIAGNOSIS — Z6836 Body mass index (BMI) 36.0-36.9, adult: Secondary | ICD-10-CM | POA: Insufficient documentation

## 2016-08-12 MED ORDER — MELOXICAM 7.5 MG PO TABS: 15.0000 mg | ORAL_TABLET | Freq: Every day | ORAL | 0 refills | Status: DC

## 2016-08-12 NOTE — ED Notes (Signed)
Bed: WTR6 Expected date:  Expected time:  Means of arrival:  Comments: 

## 2016-08-12 NOTE — ED Provider Notes (Signed)
[facility]-EMERGENCY DEPT Provider Note   CSN: [id number] Arrival date & time: 08/12/16  0522     History   Chief Complaint No chief complaint on file.   HPI Nicholas Howell is a 51 y.o. male.  The history is provided by the patient and medical records.    51 y.o. M here with bilateral knee pain.  rpeorts hx of arthritis in the knees.  Has been followed by his doctor for this but states he was just told to ice his knees which does not seem to be helping.  rpeorts over the past week he has been on his feet more than normal.  States he has been having some intermittent swelling of the knees and worsening pain.  States in the mornings knees are usually stiff but loosen up during the day.  States this morning in the bathroom having a lot of pain. States he went to the bathroom he felt his knee "buckled" causing him to fall to the floor. There was no head injury or loss of consciousness. States he did not feel he could make it through full day of work today. He has never seen orthopedics for his knees.  Denies numbness/weakness of the legs.  No fevers.  Has not tried any meds for his symptoms.  History reviewed. No pertinent past medical history.  Patient Active Problem List   Diagnosis Date Noted  . Routine adult health maintenance 06/17/2016  . Class 2 obesity due to excess calories without serious comorbidity with body mass index (BMI) of 36.0 to 36.9 in adult 06/17/2016  . Primary osteoarthritis of both knees 02/27/2016    Past Surgical History:  Procedure Laterality Date  . WRIST SURGERY         Home Medications    Prior to Admission medications   Not on File    Family History Family History  Problem Relation Age of Onset  . Diabetes Mother     Social History Social History  Substance Use Topics  . Smoking status: Never Smoker  . Smokeless tobacco: Never Used  . Alcohol use No     Allergies   Patient has no known allergies.   Review of Systems Review of Systems    Musculoskeletal: Positive for arthralgias.  All other systems reviewed and are negative.    Physical Exam Updated Vital Signs BP 117/90 (BP Location: Left Arm)   Pulse (!) 55   Temp 97.8 F (36.6 C) (Oral)   Resp 18   SpO2 98%   Physical Exam  Constitutional: He is oriented to person, place, and time. He appears well-developed and well-nourished.  HENT:  Head: Normocephalic and atraumatic.  Mouth/Throat: Oropharynx is clear and moist.  Eyes: Pupils are equal, round, and reactive to light. Conjunctivae and EOM are normal.  Neck: Normal range of motion.  Cardiovascular: Normal rate, regular rhythm and normal heart sounds.   Pulmonary/Chest: Effort normal and breath sounds normal.  Abdominal: Soft. Bowel sounds are normal.  Musculoskeletal: Normal range of motion.  (Overall normal in appearance without significant swelling or effusion, there is some crepitus of both knees with flexion and extension, no apparent ligamentous laxity, ambulatory with steady gait  Neurological: He is alert and oriented to person, place, and time.  Skin: Skin is warm and dry.  Psychiatric: He has a normal mood and affect.  Nursing note and vitals reviewed.    ED Treatments / Results  Labs (all labs ordered are listed, but only abnormal results are displayed) Labs Reviewed -  No data to display  EKG  EKG Interpretation None       Radiology No results found.  Procedures Procedures (including critical care time)  Medications Ordered in ED Medications - No data to display   Initial Impression / Assessment and Plan / ED Course  I have reviewed the triage vital signs and the nursing notes.  Pertinent labs & imaging results that were available during my care of the patient were reviewed by me and considered in my medical decision making (see chart for details).  51 y.o. M here with bilateral knee pain, hx of OA.  No significant swelling, effusion, erythema, or other acute findings noted  on exam. She does have some crepitus with flexion and extension which I suspect is due to his arthritis. There are no signs or symptoms concerning for acute gout flare or septic joint. He is not currently on any medications for his arthritis, will start trial of Modic for 2 weeks. He was given knee sleeves for compression. He understands to follow-up with his primary care doctor. He is not having any relief with conservative management, may need to follow-up with Orthopedics so information was given information for their office as well.  Discussed plan with patient, he acknowledged understanding and agreed with plan of care.  Return precautions given for new or worsening symptoms.  Final Clinical Impressions(s) / ED Diagnoses   Final diagnoses:  Bilateral primary osteoarthritis of knee    New Prescriptions Discharge Medication List as of 08/12/2016  7:01 AM    START taking these medications   Details  meloxicam (MOBIC) 7.5 MG tablet Take 2 tablets (15 mg total) by mouth daily., Starting Thu 08/12/2016, Print         Garlon HatchetSanders, Fryda Molenda M, PA-C 08/12/16 [id number]    Shon BatonHorton, Courtney F, MD 08/13/16 [phone]

## 2016-08-12 NOTE — ED Notes (Signed)
Pt complains of bilateral knee pain and swelling, no injury noted

## 2016-08-12 NOTE — Discharge Instructions (Signed)
Take the prescribed medication as directed. Can wear the knee braces to help with stability and help reduce swelling. You may continue icing if you feel this helps. Follow-up with your primary care doctor. If you're not having any relief from our interventions today, your next may be to follow-up with orthopedics so I have given you information for their office as well. Return to the ED for new or worsening symptoms.

## 2016-08-18 ENCOUNTER — Encounter: Payer: PRIVATE HEALTH INSURANCE | Admitting: Internal Medicine

## 2016-09-06 ENCOUNTER — Encounter: Payer: Self-pay | Admitting: Family

## 2017-01-24 ENCOUNTER — Emergency Department (HOSPITAL_COMMUNITY)
Admission: EM | Admit: 2017-01-24 | Discharge: 2017-01-24 | Disposition: A | Discharge status: Home / Self Care | Payer: PRIVATE HEALTH INSURANCE | Attending: Emergency Medicine | Admitting: Emergency Medicine

## 2017-01-24 ENCOUNTER — Emergency Department (HOSPITAL_COMMUNITY): Payer: PRIVATE HEALTH INSURANCE

## 2017-01-24 ENCOUNTER — Encounter (HOSPITAL_COMMUNITY): Payer: Self-pay | Admitting: Emergency Medicine

## 2017-01-24 DIAGNOSIS — G8929 Other chronic pain: Secondary | ICD-10-CM | POA: Insufficient documentation

## 2017-01-24 DIAGNOSIS — Z0279 Encounter for issue of other medical certificate: Secondary | ICD-10-CM | POA: Insufficient documentation

## 2017-01-24 DIAGNOSIS — M25562 Pain in left knee: Secondary | ICD-10-CM | POA: Insufficient documentation

## 2017-01-24 DIAGNOSIS — Z79899 Other long term (current) drug therapy: Secondary | ICD-10-CM | POA: Insufficient documentation

## 2017-01-24 DIAGNOSIS — M1990 Unspecified osteoarthritis, unspecified site: Secondary | ICD-10-CM

## 2017-01-24 HISTORY — DX: Unspecified osteoarthritis, unspecified site: M19.90

## 2017-01-24 MED ORDER — NAPROXEN 500 MG PO TABS: 500.0000 mg | ORAL_TABLET | Freq: Two times a day (BID) | ORAL | 0 refills | Status: DC

## 2017-01-24 NOTE — ED Triage Notes (Signed)
Per pt, states he fell on Sunday and again this am-states both knees are giving out on him-states he has been diagnosed with B/L arthritis in both knees

## 2017-01-24 NOTE — ED Provider Notes (Signed)
[facility]-EMERGENCY DEPT Provider Note   CSN: [id number] Arrival date & time: 01/24/17  0930     History   Chief Complaint Chief Complaint  Patient presents with  . Knee Pain    HPI Phill MyronJoey Cossey is a 52 y.o. male.  52 year old male with history of arthritis since with complaint of bilateral knee pain.  Patient reports is a long-standing issue.  Patient reports using ibuprofen at home with minimal relief.  Patient denies associated fever, acute injury, difficulty with ambulation, or other acute complaint.  Patient does request a work note -he did not go to work today because of his bilateral knee pain.   The history is provided by the patient.  Knee Pain   This is a chronic problem. The current episode started more than 1 week ago. The problem occurs constantly. The problem has not changed since onset.The pain is present in the left knee and right knee. The quality of the pain is described as aching. The pain is mild. Pertinent negatives include no numbness, full range of motion and no stiffness. The symptoms are aggravated by standing. He has tried nothing for the symptoms. The treatment provided no relief.    Past Medical History:  Diagnosis Date  . Arthritis     Patient Active Problem List   Diagnosis Date Noted  . Routine adult health maintenance 06/17/2016  . Class 2 obesity due to excess calories without serious comorbidity with body mass index (BMI) of 36.0 to 36.9 in adult 06/17/2016  . Primary osteoarthritis of both knees 02/27/2016    Past Surgical History:  Procedure Laterality Date  . WRIST SURGERY         Home Medications    Prior to Admission medications   Medication Sig Start Date End Date Taking? Authorizing Provider  meloxicam (MOBIC) 7.5 MG tablet Take 2 tablets (15 mg total) by mouth daily. 08/12/16   Garlon HatchetSanders, Lisa M, PA-C  naproxen (NAPROSYN) 500 MG tablet Take 1 tablet (500 mg total) by mouth 2 (two) times daily. 01/24/17    Wynetta FinesMessick, [place] Folden C, MD    Family History Family History  Problem Relation Age of Onset  . Diabetes Mother     Social History Social History   Tobacco Use  . Smoking status: Never Smoker  . Smokeless tobacco: Never Used  Substance Use Topics  . Alcohol use: No  . Drug use: No     Allergies   Patient has no known allergies.   Review of Systems Review of Systems  Musculoskeletal: Negative for stiffness.       Chronic bilateral knee pain.  Neurological: Negative for numbness.  All other systems reviewed and are negative.    Physical Exam Updated Vital Signs BP 120/87 (BP Location: Right Arm)   Pulse 69   Temp 98 F (36.7 C) (Oral)   Resp 16   Ht 6\' 1"  (1.854 m)   Wt 122.5 kg (270 lb)   SpO2 100%   BMI 35.62 kg/m   Physical Exam  Constitutional: He is oriented to person, place, and time. He appears well-developed and well-nourished. No distress.  HENT:  Head: Normocephalic and atraumatic.  Mouth/Throat: Oropharynx is clear and moist.  Eyes: Conjunctivae and EOM are normal. Pupils are equal, round, and reactive to light.  Neck: Normal range of motion. Neck supple.  Cardiovascular: Normal rate, regular rhythm and normal heart sounds.  Pulmonary/Chest: Effort normal and breath sounds normal. No respiratory distress.  Abdominal: Soft. He exhibits no  distension. There is no tenderness.  Musculoskeletal: Normal range of motion. He exhibits no edema or deformity.  Localizes tenderness to bilateral knees.  No effusion noted on exam.  No erythema.  Full active range of motion.    Gait is normal.  Distal bilateral lower extremities are neurovascular intact.  Neurological: He is alert and oriented to person, place, and time.  Skin: Skin is warm and dry.  Psychiatric: He has a normal mood and affect.  Nursing note and vitals reviewed.    ED Treatments / Results  Labs (all labs ordered are listed, but only abnormal results are displayed) Labs Reviewed - No data  to display  EKG  EKG Interpretation None       Radiology Dg Knee Complete 4 Views Left  Result Date: 01/24/2017 CLINICAL DATA:  52 year old male with a history of fall and knee pain EXAM: LEFT KNEE - COMPLETE 4+ VIEW COMPARISON:  None. FINDINGS: No acute displaced fracture. No joint effusion. Medial and lateral joint space narrowing with marginal osteophyte formation. Degenerative changes at the patellofemoral joint. IMPRESSION: Negative for acute bony abnormality. Tricompartmental osteoarthritis Electronically Signed   By: Gilmer Mor D.O.   On: 01/24/2017 10:45   Dg Knee Complete 4 Views Right  Result Date: 01/24/2017 CLINICAL DATA:  , knee pain EXAM: RIGHT KNEE - COMPLETE 4+ VIEW COMPARISON:  None. FINDINGS: Mild tricompartment degenerative changes with joint space narrowing and spurring. No joint effusion. No acute bony abnormality. Specifically, no fracture, subluxation, or dislocation. IMPRESSION: Mild tricompartment degenerative changes. No acute bony abnormality. Electronically Signed   By: Charlett Nose M.D.   On: 01/24/2017 10:42    Procedures Procedures (including critical care time)  Medications Ordered in ED Medications - No data to display   Initial Impression / Assessment and Plan / ED Course  I have reviewed the triage vital signs and the nursing notes.  Pertinent labs & imaging results that were available during my care of the patient were reviewed by me and considered in my medical decision making (see chart for details).     MDM medical screen complete.  Presentation today is consistent with chronic bilateral knee osteoarthritis.  Patient was given instruction on the importance of close follow-up with orthopedics (and his PMD).  Symptomatic management was discussed extensively.  Patient understands the need for close follow-up.  Strict return precautions are given and understood.  Final Clinical Impressions(s) / ED Diagnoses   Final diagnoses:  Arthritis     ED Discharge Orders        Ordered    naproxen (NAPROSYN) 500 MG tablet  2 times daily     01/24/17 1155       Wynetta Fines, MD 01/24/17 1201

## 2017-06-23 ENCOUNTER — Encounter: Payer: PRIVATE HEALTH INSURANCE | Admitting: Family

## 2017-06-23 DIAGNOSIS — Z0289 Encounter for other administrative examinations: Secondary | ICD-10-CM

## 2017-07-14 ENCOUNTER — Ambulatory Visit (INDEPENDENT_AMBULATORY_CARE_PROVIDER_SITE_OTHER): Payer: PRIVATE HEALTH INSURANCE

## 2017-07-14 ENCOUNTER — Encounter: Payer: Self-pay | Admitting: Family Medicine

## 2017-07-14 ENCOUNTER — Ambulatory Visit (INDEPENDENT_AMBULATORY_CARE_PROVIDER_SITE_OTHER): Payer: PRIVATE HEALTH INSURANCE | Admitting: Family Medicine

## 2017-07-14 VITALS — BP 110/76 | HR 71 | Temp 97.6°F | Ht 73.0 in | Wt 271.0 lb

## 2017-07-14 DIAGNOSIS — Z1211 Encounter for screening for malignant neoplasm of colon: Secondary | ICD-10-CM

## 2017-07-14 DIAGNOSIS — E291 Testicular hypofunction: Secondary | ICD-10-CM | POA: Diagnosis not present

## 2017-07-14 DIAGNOSIS — Z0001 Encounter for general adult medical examination with abnormal findings: Secondary | ICD-10-CM

## 2017-07-14 DIAGNOSIS — M174 Other bilateral secondary osteoarthritis of knee: Secondary | ICD-10-CM | POA: Diagnosis not present

## 2017-07-14 DIAGNOSIS — R5383 Other fatigue: Secondary | ICD-10-CM | POA: Diagnosis not present

## 2017-07-14 LAB — COMPREHENSIVE METABOLIC PANEL
ALBUMIN: 4.3 g/dL (ref 3.5–5.2)
ALK PHOS: 53 U/L (ref 39–117)
ALT: 51 U/L (ref 0–53)
AST: 45 U/L — ABNORMAL HIGH (ref 0–37)
BILIRUBIN TOTAL: 0.4 mg/dL (ref 0.2–1.2)
BUN: 13 mg/dL (ref 6–23)
CO2: 30 mEq/L (ref 19–32)
Calcium: 10 mg/dL (ref 8.4–10.5)
Chloride: 104 mEq/L (ref 96–112)
Creatinine, Ser: 0.87 mg/dL (ref 0.40–1.50)
GFR: 118.25 mL/min (ref >60.00)
GLUCOSE: 95 mg/dL (ref 70–99)
Potassium: 4.4 mEq/L (ref 3.5–5.1)
SODIUM: 139 meq/L (ref 135–145)
TOTAL PROTEIN: 7.6 g/dL (ref 6.0–8.3)

## 2017-07-14 LAB — CBC
HCT: 45.7 % (ref 39.0–52.0)
HEMOGLOBIN: 15 g/dL (ref 13.0–17.0)
MCHC: 32.8 g/dL (ref 30.0–36.0)
MCV: 90.5 fl (ref 78.0–100.0)
Platelets: 225 10*3/uL (ref 150.0–400.0)
RBC: 5.05 Mil/uL (ref 4.22–5.81)
RDW: 13.8 % (ref 11.5–15.5)
WBC: 6.4 10*3/uL (ref 4.0–10.5)

## 2017-07-14 LAB — URINALYSIS, ROUTINE W REFLEX MICROSCOPIC
BILIRUBIN URINE: NEGATIVE
HGB URINE DIPSTICK: NEGATIVE
Ketones, ur: NEGATIVE
Leukocytes, UA: NEGATIVE
Nitrite: NEGATIVE
PH: 6 (ref 5.0–8.0)
RBC / HPF: NONE SEEN (ref 0–?)
SPECIFIC GRAVITY, URINE: 1.025 (ref 1.000–1.030)
TOTAL PROTEIN, URINE-UPE24: NEGATIVE
URINE GLUCOSE: NEGATIVE
UROBILINOGEN UA: 0.2 (ref 0.0–1.0)

## 2017-07-14 LAB — LIPID PANEL
CHOLESTEROL: 184 mg/dL (ref 0–200)
HDL: 36.8 mg/dL — ABNORMAL LOW (ref >39.00)
LDL Cholesterol: 121 mg/dL — ABNORMAL HIGH (ref 0–99)
NONHDL: 147.64
Total CHOL/HDL Ratio: 5
Triglycerides: 133 mg/dL (ref 0.0–149.0)
VLDL: 26.6 mg/dL (ref 0.0–40.0)

## 2017-07-14 LAB — PSA: PSA: 1.25 ng/mL (ref 0.10–4.00)

## 2017-07-14 LAB — TSH: TSH: 0.67 u[IU]/mL (ref 0.35–4.50)

## 2017-07-14 LAB — TESTOSTERONE: Testosterone: 213.29 ng/dL — ABNORMAL LOW (ref 300.00–890.00)

## 2017-07-14 NOTE — Progress Notes (Addendum)
Subjective:  Patient ID: Nicholas MyronJoey Mccard, male    DOB: 08/08/1965  Age: 52 y.o. MRN: [id number]  CC: Annual Exam   HPI Nicholas Howell presents for complete physical exam and to discuss medical issues.  He is married and lives with his wife.  They have 4 children together who are doing reasonably well.  He is unaware of his father's health history but his mother is approaching [id number] and does have a history of diabetes.  Patient complains of some fatigue with decreased libido.  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  He denies weight loss or increased urinary frequency.  He wonders about low fatigue.  He has ongoing issues with chronic bilateral knee pain.  His knees full sometimes locking give way.  He has bilateral medial knee pain and on occasion has lateral right knee pain.  He did play sports through high school and admits that there may have been multiple injuries.  He uses ibuprofen or Aleve for pain when needed.  He has never had a colonoscopy.  He has no issues with his urine flow.  He does not smoke, drink alcohol or use illicit drugs.  Outpatient Medications Prior to Visit  Medication Sig Dispense Refill  . meloxicam (MOBIC) 7.5 MG tablet Take 2 tablets (15 mg total) by mouth daily. 30 tablet 0  . naproxen (NAPROSYN) 500 MG tablet Take 1 tablet (500 mg total) by mouth 2 (two) times daily. 30 tablet 0   No facility-administered medications prior to visit.     ROS Review of Systems  Constitutional: Positive for fatigue. Negative for chills, fever and unexpected weight change.  HENT: Negative.   Eyes: Negative.   Respiratory: Negative for cough and wheezing.   Cardiovascular: Negative for chest pain.  Gastrointestinal: Negative for anal bleeding, blood in stool and constipation.  Endocrine: Negative for polyphagia and polyuria.  Genitourinary: Negative.   Musculoskeletal: Positive for arthralgias, gait problem and joint swelling.  Skin: Negative.     Allergic/Immunologic: Negative for immunocompromised state.  Neurological: Negative for weakness, numbness and headaches.  Hematological: Does not bruise/bleed easily.  Psychiatric/Behavioral: Negative.     Objective:  BP 110/76   Pulse 71   Temp 97.6 F (36.4 C)   Ht 6\' 1"  (1.854 m)   Wt 271 lb (122.9 kg)   SpO2 97%   BMI 35.75 kg/m   BP Readings from Last 3 Encounters:  07/14/17 110/76  01/24/17 109/62  08/12/16 117/90    Wt Readings from Last 3 Encounters:  07/14/17 271 lb (122.9 kg)  01/24/17 270 lb (122.5 kg)  06/17/16 274 lb 1.9 oz (124.3 kg)    Physical Exam  Constitutional: He is oriented to person, place, and time. He appears well-developed and well-nourished. No distress.  HENT:  Head: Normocephalic and atraumatic.  Right Ear: External ear normal.  Left Ear: External ear normal.  Mouth/Throat: Oropharynx is clear and moist. No oropharyngeal exudate.  Eyes: Pupils are equal, round, and reactive to light. Conjunctivae and EOM are normal. Right eye exhibits no discharge. Left eye exhibits no discharge. No scleral icterus.  Neck: Normal range of motion. Neck supple. No JVD present. No tracheal deviation present. No thyromegaly present.  Cardiovascular: Normal rate, regular rhythm and normal heart sounds.  Pulmonary/Chest: Effort normal and breath sounds normal.  Abdominal: Soft. Bowel sounds are normal. He exhibits no distension. There is no tenderness. There is no guarding.  Genitourinary: Rectum normal. Rectal exam shows no external  hemorrhoid, no internal hemorrhoid, no fissure, no mass, no tenderness, anal tone normal and guaiac negative stool. Prostate is not enlarged and not tender.  Musculoskeletal: Normal range of motion.       Right knee: He exhibits normal range of motion, no swelling and no effusion. Tenderness found. Medial joint line and lateral joint line tenderness noted.       Left knee: He exhibits normal range of motion, no swelling and no  effusion. Tenderness found. Medial joint line tenderness noted.  Lymphadenopathy:    He has no cervical adenopathy.  Neurological: He is alert and oriented to person, place, and time.  Skin: Skin is warm and dry. He is not diaphoretic.  Psychiatric: He has a normal mood and affect. His behavior is normal. Thought content normal.    Lab Results  Component Value Date   WBC 6.4 07/14/2017   HGB 15.0 07/14/2017   HCT 45.7 07/14/2017   PLT 225.0 07/14/2017   GLUCOSE 95 07/14/2017   CHOL 184 07/14/2017   TRIG 133.0 07/14/2017   HDL 36.80 (L) 07/14/2017   LDLCALC 121 (H) 07/14/2017   ALT 51 07/14/2017   AST 45 (H) 07/14/2017   NA 139 07/14/2017   K 4.4 07/14/2017   CL 104 07/14/2017   CREATININE 0.87 07/14/2017   BUN 13 07/14/2017   CO2 30 07/14/2017   TSH 0.67 07/14/2017   PSA 1.25 07/14/2017    Dg Knee Complete 4 Views Left  Result Date: 01/24/2017 CLINICAL DATA:  52 year old male with a history of fall and knee pain EXAM: LEFT KNEE - COMPLETE 4+ VIEW COMPARISON:  None. FINDINGS: No acute displaced fracture. No joint effusion. Medial and lateral joint space narrowing with marginal osteophyte formation. Degenerative changes at the patellofemoral joint. IMPRESSION: Negative for acute bony abnormality. Tricompartmental osteoarthritis Electronically Signed   By: Gilmer Mor D.O.   On: 01/24/2017 10:45   Dg Knee Complete 4 Views Right  Result Date: 01/24/2017 CLINICAL DATA:  , knee pain EXAM: RIGHT KNEE - COMPLETE 4+ VIEW COMPARISON:  None. FINDINGS: Mild tricompartment degenerative changes with joint space narrowing and spurring. No joint effusion. No acute bony abnormality. Specifically, no fracture, subluxation, or dislocation. IMPRESSION: Mild tricompartment degenerative changes. No acute bony abnormality. Electronically Signed   By: Charlett Nose M.D.   On: 01/24/2017 10:42    Assessment & Plan:   Nicholas Howell was seen today for annual exam.  Diagnoses and all orders for this  visit:  Encounter for health maintenance examination with abnormal findings -     CBC -     Comprehensive metabolic panel -     HIV antibody -     Lipid panel -     PSA -     TSH -     Urinalysis, Routine w reflex microscopic  Other bilateral secondary osteoarthritis of knee -     DG Knee Complete 4 Views Left; Future -     DG Knee Complete 4 Views Right; Future -     DG Knee Complete 4 Views Right -     DG Knee Complete 4 Views Left -     Ambulatory referral to Orthopedic Surgery  Fatigue, unspecified type -     TSH -     Testosterone  Morbid obesity (HCC) -     TSH  Screen for colon cancer -     Ambulatory referral to Gastroenterology  Androgen deficiency -     testosterone cypionate (DEPOTESTOSTERONE CYPIONATE) 200 MG/ML  injection; Inject 1 mL (200 mg total) into the muscle every 14 (fourteen) days.   I have discontinued Nicholas Howell's meloxicam and naproxen. I am also having him start on testosterone cypionate.  Meds ordered this encounter  Medications  . testosterone cypionate (DEPOTESTOSTERONE CYPIONATE) 200 MG/ML injection    Sig: Inject 1 mL (200 mg total) into the muscle every 14 (fourteen) days.    Dispense:  10 mL    Refill:  0   Patient's weight was discussed and the need for weight loss was encouraged.  He was given information on BMI and obesity.  He was encouraged to pursue nonweightbearing modes of aerobic conditioning.  Anticipatory guidance was given with regards to health maintenance and prevention of disease.  Discussed the fact that a large part of his fatigue may be due to his lack of exercise and his obesity.  He is ready to pursue orthopedic surgeon referral for his knees.  He is feeling that he may be nearing the time for replacements.  This is been an ongoing issue and he has tried splints and nonsteroidals with minimal relief.  Follow-up: Return in about 3 months (around 10/14/2017).  Mliss Sax, MD

## 2017-07-14 NOTE — Patient Instructions (Signed)
Fatigue Fatigue is feeling tired all of the time, a lack of energy, or a lack of motivation. Occasional or mild fatigue is often a normal response to activity or life in general. However, long-lasting (chronic) or extreme fatigue may indicate an underlying medical condition. Follow these instructions at home: Watch your fatigue for any changes. The following actions may help to lessen any discomfort you are feeling:  Talk to your health care provider about how much sleep you need each night. Try to get the required amount every night.  Take medicines only as directed by your health care provider.  Eat a healthy and nutritious diet. Ask your health care provider if you need help changing your diet.  Drink enough fluid to keep your urine clear or pale yellow.  Practice ways of relaxing, such as yoga, meditation, massage therapy, or acupuncture.  Exercise regularly.  Change situations that cause you stress. Try to keep your work and personal routine reasonable.  Do not abuse illegal drugs.  Limit alcohol intake to no more than 1 drink per day for nonpregnant women and 2 drinks per day for men. One drink equals 12 ounces of beer, 5 ounces of wine, or 1 ounces of hard liquor.  Take a multivitamin, if directed by your health care provider.  Contact a health care provider if:  Your fatigue does not get better.  You have a fever.  You have unintentional weight loss or gain.  You have headaches.  You have difficulty: ? Falling asleep. ? Sleeping throughout the night.  You feel angry, guilty, anxious, or sad.  You are unable to have a bowel movement (constipation).  You skin is dry.  Your legs or another part of your body is swollen. Get help right away if:  You feel confused.  Your vision is blurry.  You feel faint or pass out.  You have a severe headache.  You have severe abdominal, pelvic, or back pain.  You have chest pain, shortness of breath, or an irregular or  fast heartbeat.  You are unable to urinate or you urinate less than normal.  You develop abnormal bleeding, such as bleeding from the rectum, vagina, nose, lungs, or nipples.  You vomit blood.  You have thoughts about harming yourself or committing suicide.  You are worried that you might harm someone else. This information is not intended to replace advice given to you by your health care provider. Make sure you discuss any questions you have with your health care provider. Document Released: 11/01/2006 Document Revised: 06/12/2015 Document Reviewed: 05/08/2013 Elsevier Interactive Patient Education  2018 [place]. BMI for Adults Body mass index (BMI) is a number that is calculated from a person's weight and height. In most adults, the number is used to find how much of an adult's weight is made up of fat. BMI is not as accurate as a direct measure of body fat. How is BMI calculated? BMI is calculated by dividing weight in kilograms by height in meters squared. It can also be calculated by dividing weight in pounds by height in inches squared, then multiplying the resulting number by 703. Charts are available to help you find your BMI quickly and easily without doing this calculation. How is BMI interpreted? Health care professionals use BMI charts to identify whether an adult is underweight, at a normal weight, or overweight based on the following guidelines:  Underweight: BMI less than 18.5.  Normal weight: BMI between 18.5 and 24.9.  Overweight: BMI between 25  and 29.9.  Obese: BMI of 30 and above.  BMI is usually interpreted the same for males and females. Weight includes both fat and muscle, so someone with a muscular build, such as an athlete, may have a BMI that is higher than 24.9. In cases like these, BMI may not accurately depict body fat. To determine if excess body fat is the cause of a BMI of 25 or higher, further assessments may need to be done by a health care  provider. Why is BMI a useful tool? BMI is used to identify a possible weight problem that may be related to a medical problem or may increase the risk for medical problems. BMI can also be used to promote changes to reach a healthy weight. This information is not intended to replace advice given to you by your health care provider. Make sure you discuss any questions you have with your health care provider. Document Released: 09/16/2003 Document Revised: 05/15/2015 Document Reviewed: 06/01/2013 Elsevier Interactive Patient Education  2018 [employer].  Obesity, Adult Obesity is the condition of having too much total body fat. Being overweight or obese means that your weight is greater than what is considered healthy for your body size. Obesity is determined by a measurement called BMI. BMI is an estimate of body fat and is calculated from height and weight. For adults, a BMI of 30 or higher is considered obese. Obesity can eventually lead to other health concerns and major illnesses, including:  Stroke.  Coronary artery disease (CAD).  Type 2 diabetes.  Some types of cancer, including cancers of the colon, breast, uterus, and gallbladder.  Osteoarthritis.  High blood pressure (hypertension).  High cholesterol.  Sleep apnea.  Gallbladder stones.  Infertility problems.  What are the causes? The main cause of obesity is taking in (consuming) more calories than your body uses for energy. Other factors that contribute to this condition may include:  Being born with genes that make you more likely to become obese.  Having a medical condition that causes obesity. These conditions include: ? Hypothyroidism. ? Polycystic ovarian syndrome (PCOS). ? Binge-eating disorder. ? Cushing syndrome.  Taking certain medicines, such as steroids, antidepressants, and seizure medicines.  Not being physically active (sedentary lifestyle).  Living where there are limited places to exercise  safely or buy healthy foods.  Not getting enough sleep.  What increases the risk? The following factors may increase your risk of this condition:  Having a family history of obesity.  Being a woman of African-American descent.  Being a man of Hispanic descent.  What are the signs or symptoms? Having excessive body fat is the main symptom of this condition. How is this diagnosed? This condition may be diagnosed based on:  Your symptoms.  Your medical history.  A physical exam. Your health care provider may measure: ? Your BMI. If you are an adult with a BMI between 25 and less than 30, you are considered overweight. If you are an adult with a BMI of 30 or higher, you are considered obese. ? The distances around your hips and your waist (circumferences). These may be compared to each other to help diagnose your condition. ? Your skinfold thickness. Your health care provider may gently pinch a fold of your skin and measure it.  How is this treated? Treatment for this condition often includes changing your lifestyle. Treatment may include some or all of the following:  Dietary changes. Work with your health care provider and a dietitian to set  a weight-loss goal that is healthy and reasonable for you. Dietary changes may include eating: ? Smaller portions. A portion size is the amount of a particular food that is healthy for you to eat at one time. This varies from person to person. ? Low-calorie or low-fat options. ? More whole grains, fruits, and vegetables.  Regular physical activity. This may include aerobic activity (cardio) and strength training.  Medicine to help you lose weight. Your health care provider may prescribe medicine if you are unable to lose 1 pound a week after 6 weeks of eating more healthily and doing more physical activity.  Surgery. Surgical options may include gastric banding and gastric bypass. Surgery may be done if: ? Other treatments have not helped to  improve your condition. ? You have a BMI of 40 or higher. ? You have life-threatening health problems related to obesity.  Follow these instructions at home:  Eating and drinking   Follow recommendations from your health care provider about what you eat and drink. Your health care provider may advise you to: ? Limit fast foods, sweets, and processed snack foods. ? Choose low-fat options, such as low-fat milk instead of whole milk. ? Eat 5 or more servings of fruits or vegetables every day. ? Eat at home more often. This gives you more control over what you eat. ? Choose healthy foods when you eat out. ? Learn what a healthy portion size is. ? Keep low-fat snacks on hand. ? Avoid sugary drinks, such as soda, fruit juice, iced tea sweetened with sugar, and flavored milk. ? Eat a healthy breakfast.  Drink enough water to keep your urine clear or pale yellow.  Do not go without eating for long periods of time (do not fast) or follow a fad diet. Fasting and fad diets can be unhealthy and even dangerous. Physical Activity  Exercise regularly, as told by your health care provider. Ask your health care provider what types of exercise are safe for you and how often you should exercise.  Warm up and stretch before being active.  Cool down and stretch after being active.  Rest between periods of activity. Lifestyle  Limit the time that you spend in front of your TV, computer, or video game system.  Find ways to reward yourself that do not involve food.  Limit alcohol intake to no more than 1 drink a day for nonpregnant women and 2 drinks a day for men. One drink equals 12 oz of beer, 5 oz of wine, or 1 oz of hard liquor. General instructions  Keep a weight loss journal to keep track of the food you eat and how much you exercise you get.  Take over-the-counter and prescription medicines only as told by your health care provider.  Take vitamins and supplements only as told by your  health care provider.  Consider joining a support group. Your health care provider may be able to recommend a support group.  Keep all follow-up visits as told by your health care provider. This is important. Contact a health care provider if:  You are unable to meet your weight loss goal after 6 weeks of dietary and lifestyle changes. This information is not intended to replace advice given to you by your health care provider. Make sure you discuss any questions you have with your health care provider. Document Released: 02/12/2004 Document Revised: 06/09/2015 Document Reviewed: 10/23/2014 Elsevier Interactive Patient Education  2018 [employer].  Osteoarthritis Osteoarthritis is a type of arthritis that  affects tissue that covers the ends of bones in joints (cartilage). Cartilage acts as a cushion between the bones and helps them move smoothly. Osteoarthritis results when cartilage in the joints gets worn down. Osteoarthritis is sometimes called "wear and tear" arthritis. Osteoarthritis is the most common form of arthritis. It often occurs in older people. It is a condition that gets worse over time (a progressive condition). Joints that are most often affected by this condition are in:  Fingers.  Toes.  Hips.  Knees.  Spine, including neck and lower back.  What are the causes? This condition is caused by age-related wearing down of cartilage that covers the ends of bones. What increases the risk? The following factors may make you more likely to develop this condition:  Older age.  Being overweight or obese.  Overuse of joints, such as in athletes.  Past injury of a joint.  Past surgery on a joint.  Family history of osteoarthritis.  What are the signs or symptoms? The main symptoms of this condition are pain, swelling, and stiffness in the joint. The joint may lose its shape over time. Small pieces of bone or cartilage may break off and float inside of the joint,  which may cause more pain and damage to the joint. Small deposits of bone (osteophytes) may grow on the edges of the joint. Other symptoms may include:  A grating or scraping feeling inside the joint when you move it.  Popping or creaking sounds when you move.  Symptoms may affect one or more joints. Osteoarthritis in a major joint, such as your knee or hip, can make it painful to walk or exercise. If you have osteoarthritis in your hands, you might not be able to grip items, twist your hand, or control small movements of your hands and fingers (fine motor skills). How is this diagnosed? This condition may be diagnosed based on:  Your medical history.  A physical exam.  Your symptoms.  X-rays of the affected joint(s).  Blood tests to rule out other types of arthritis.  How is this treated? There is no cure for this condition, but treatment can help to control pain and improve joint function. Treatment plans may include:  A prescribed exercise program that allows for rest and joint relief. You may work with a physical therapist.  A weight control plan.  Pain relief techniques, such as: ? Applying heat and cold to the joint. ? Electric pulses delivered to nerve endings under the skin (transcutaneous electrical nerve stimulation, or TENS). ? Massage. ? Certain nutritional supplements.  NSAIDs or prescription medicines to help relieve pain.  Medicine to help relieve pain and inflammation (corticosteroids). This can be given by mouth (orally) or as an injection.  Assistive devices, such as a brace, wrap, splint, specialized glove, or cane.  Surgery, such as: ? An osteotomy. This is done to reposition the bones and relieve pain or to remove loose pieces of bone and cartilage. ? Joint replacement surgery. You may need this surgery if you have very bad (advanced) osteoarthritis.  Follow these instructions at home: Activity  Rest your affected joints as directed by your health  care provider.  Do not drive or use heavy machinery while taking prescription pain medicine.  Exercise as directed. Your health care provider or physical therapist may recommend specific types of exercise, such as: ? Strengthening exercises. These are done to strengthen the muscles that support joints that are affected by arthritis. They can be performed with weights or  with exercise bands to add resistance. ? Aerobic activities. These are exercises, such as brisk walking or water aerobics, that get your heart pumping. ? Range-of-motion activities. These keep your joints easy to move. ? Balance and agility exercises. Managing pain, stiffness, and swelling  If directed, apply heat to the affected area as often as told by your health care provider. Use the heat source that your health care provider recommends, such as a moist heat pack or a heating pad. ? If you have a removable assistive device, remove it as told by your health care provider. ? Place a towel between your skin and the heat source. If your health care provider tells you to keep the assistive device on while you apply heat, place a towel between the assistive device and the heat source. ? Leave the heat on for 20-30 minutes. ? Remove the heat if your skin turns bright red. This is especially important if you are unable to feel pain, heat, or cold. You may have a greater risk of getting burned.  If directed, put ice on the affected joint: ? If you have a removable assistive device, remove it as told by your health care provider. ? Put ice in a plastic bag. ? Place a towel between your skin and the bag. If your health care provider tells you to keep the assistive device on during icing, place a towel between the assistive device and the bag. ? Leave the ice on for 20 minutes, 2-3 times a day. General instructions  Take over-the-counter and prescription medicines only as told by your health care provider.  Maintain a healthy  weight. Follow instructions from your health care provider for weight control. These may include dietary restrictions.  Do not use any products that contain nicotine or tobacco, such as cigarettes and e-cigarettes. These can delay bone healing. If you need help quitting, ask your health care provider.  Use assistive devices as directed by your health care provider.  Keep all follow-up visits as told by your health care provider. This is important. Where to find more information:  [employer] of Arthritis and Musculoskeletal and Skin Diseases: www.niams.SouthExposed.es  [employer] on Aging: http://kim-miller.com/  American College of Rheumatology: www.rheumatology.org Contact a health care provider if:  Your skin turns red.  You develop a rash.  You have pain that gets worse.  You have a fever along with joint or muscle aches. Get help right away if:  You lose a lot of weight.  You suddenly lose your appetite.  You have night sweats. Summary  Osteoarthritis is a type of arthritis that affects tissue covering the ends of bones in joints (cartilage).  This condition is caused by age-related wearing down of cartilage that covers the ends of bones.  The main symptom of this condition is pain, swelling, and stiffness in the joint.  There is no cure for this condition, but treatment can help to control pain and improve joint function. This information is not intended to replace advice given to you by your health care provider. Make sure you discuss any questions you have with your health care provider. Document Released: 01/04/2005 Document Revised: 09/08/2015 Document Reviewed: 09/08/2015 Elsevier Interactive Patient Education  2018 [place], Male Preventive care refers to lifestyle choices and visits with your health care provider that can promote health and wellness. What does preventive care include?  A yearly physical exam. This is also  called an annual well check.  Dental  exams once or twice a year.  Routine eye exams. Ask your health care provider how often you should have your eyes checked.  Personal lifestyle choices, including: ? Daily care of your teeth and gums. ? Regular physical activity. ? Eating a healthy diet. ? Avoiding tobacco and drug use. ? Limiting alcohol use. ? Practicing safe sex. ? Taking low-dose aspirin every day starting at age 73. What happens during an annual well check? The services and screenings done by your health care provider during your annual well check will depend on your age, overall health, lifestyle risk factors, and family history of disease. Counseling Your health care provider may ask you questions about your:  Alcohol use.  Tobacco use.  Drug use.  Emotional well-being.  Home and relationship well-being.  Sexual activity.  Eating habits.  Work and work Statistician.  Screening You may have the following tests or measurements:  Height, weight, and BMI.  Blood pressure.  Lipid and cholesterol levels. These may be checked every 5 years, or more frequently if you are over 70 years old.  Skin check.  Lung cancer screening. You may have this screening every year starting at age 76 if you have a 30-pack-year history of smoking and currently smoke or have quit within the past 15 years.  Fecal occult blood test (FOBT) of the stool. You may have this test every year starting at age 90.  Flexible sigmoidoscopy or colonoscopy. You may have a sigmoidoscopy every 5 years or a colonoscopy every 10 years starting at age 46.  Prostate cancer screening. Recommendations will vary depending on your family history and other risks.  Hepatitis C blood test.  Hepatitis B blood test.  Sexually transmitted disease (STD) testing.  Diabetes screening. This is done by checking your blood sugar (glucose) after you have not eaten for a while (fasting). You may have this done  every 1-3 years.  Discuss your test results, treatment options, and if necessary, the need for more tests with your health care provider. Vaccines Your health care provider may recommend certain vaccines, such as:  Influenza vaccine. This is recommended every year.  Tetanus, diphtheria, and acellular pertussis (Tdap, Td) vaccine. You may need a Td booster every 10 years.  Varicella vaccine. You may need this if you have not been vaccinated.  Zoster vaccine. You may need this after age 40.  Measles, mumps, and rubella (MMR) vaccine. You may need at least one dose of MMR if you were born in 1957 or later. You may also need a second dose.  Pneumococcal 13-valent conjugate (PCV13) vaccine. You may need this if you have certain conditions and have not been vaccinated.  Pneumococcal polysaccharide (PPSV23) vaccine. You may need one or two doses if you smoke cigarettes or if you have certain conditions.  Meningococcal vaccine. You may need this if you have certain conditions.  Hepatitis A vaccine. You may need this if you have certain conditions or if you travel or work in places where you may be exposed to hepatitis A.  Hepatitis B vaccine. You may need this if you have certain conditions or if you travel or work in places where you may be exposed to hepatitis B.  Haemophilus influenzae type b (Hib) vaccine. You may need this if you have certain risk factors.  Talk to your health care provider about which screenings and vaccines you need and how often you need them. This information is not intended to replace advice given to you by your health  care provider. Make sure you discuss any questions you have with your health care provider. Document Released: 01/31/2015 Document Revised: 09/24/2015 Document Reviewed: 11/05/2014 Elsevier Interactive Patient Education  [employer].

## 2017-07-15 LAB — HIV ANTIBODY (ROUTINE TESTING W REFLEX): HIV: NONREACTIVE

## 2017-07-18 ENCOUNTER — Telehealth: Payer: Self-pay | Admitting: Family Medicine

## 2017-07-18 NOTE — Telephone Encounter (Signed)
Copied from CRM [phone]. Topic: Quick Communication - Lab Results >> Jul 18, 2017 11:27 AM Marylen PontoMcneil, Ja-Kwan wrote: Pt returned call for lab results. Pt requests return call anytime after 4 pm. Cb# [phone]

## 2017-07-18 NOTE — Telephone Encounter (Signed)
Charted in result notes. 

## 2017-07-18 NOTE — Telephone Encounter (Signed)
Left VM to call back for labs.

## 2017-07-20 ENCOUNTER — Encounter: Payer: PRIVATE HEALTH INSURANCE | Admitting: Family Medicine

## 2017-07-22 DIAGNOSIS — E291 Testicular hypofunction: Secondary | ICD-10-CM | POA: Insufficient documentation

## 2017-07-22 MED ORDER — TESTOSTERONE CYPIONATE 200 MG/ML IM SOLN: 200.0000 mg | INTRAMUSCULAR | 0 refills | Status: DC

## 2017-07-22 NOTE — Addendum Note (Signed)
Addended by: Andrez GrimeKREMER, Leslye Puccini A on: 07/22/2017 03:38 PM   Modules accepted: Orders

## 2017-07-28 ENCOUNTER — Telehealth: Payer: Self-pay

## 2017-07-28 NOTE — Telephone Encounter (Signed)
Prior Authorization approved through 01.11.2020. Approval faxed back to pharmacy.

## 2017-07-28 NOTE — Telephone Encounter (Signed)
Received PA request for testosterone cyp injection. PA submitted via covermymeds. Key: [id number]

## 2017-08-03 ENCOUNTER — Ambulatory Visit: Payer: PRIVATE HEALTH INSURANCE

## 2017-08-08 ENCOUNTER — Ambulatory Visit (INDEPENDENT_AMBULATORY_CARE_PROVIDER_SITE_OTHER): Payer: Self-pay | Admitting: Orthopaedic Surgery

## 2017-08-24 ENCOUNTER — Ambulatory Visit (INDEPENDENT_AMBULATORY_CARE_PROVIDER_SITE_OTHER): Payer: PRIVATE HEALTH INSURANCE

## 2017-08-24 DIAGNOSIS — E291 Testicular hypofunction: Secondary | ICD-10-CM | POA: Diagnosis not present

## 2017-08-24 MED ORDER — TESTOSTERONE CYPIONATE 200 MG/ML IM SOLN
200.0000 mg | Freq: Once | INTRAMUSCULAR | Status: AC
  Administered 2017-08-24: 200 mg via INTRAMUSCULAR

## 2017-08-24 NOTE — Progress Notes (Addendum)
Patient came into the clinic today for his first testosterone injection as ordered by Dr. Doreene BurkeKremer. Patient brought the vial with him. Injection given on the left side, and patient tolerated well. No signs or symptoms of a reaction prior to leaving the exam room. Patient will stop at the front desk and schedule his next injection for 2 weeks from today.   Agreed.

## 2017-09-07 ENCOUNTER — Ambulatory Visit (INDEPENDENT_AMBULATORY_CARE_PROVIDER_SITE_OTHER): Payer: PRIVATE HEALTH INSURANCE

## 2017-09-07 DIAGNOSIS — E291 Testicular hypofunction: Secondary | ICD-10-CM | POA: Diagnosis not present

## 2017-09-07 MED ORDER — TESTOSTERONE CYPIONATE 200 MG/ML IM SOLN
200.0000 mg | Freq: Once | INTRAMUSCULAR | Status: AC
  Administered 2017-09-07: 200 mg via INTRAMUSCULAR

## 2017-09-07 NOTE — Progress Notes (Addendum)
Patient came into the office to receive his 2nd testosterone injection. Patient received the injection IM in the right upper outer quadrant. Patient tolerated the injection well. No signs/symptoms of a reaction after 10 minutes in the exam room. Patient will schedule his next injection for 14 days from today.  Agreed.

## 2017-09-12 ENCOUNTER — Encounter: Payer: Self-pay | Admitting: Family Medicine

## 2017-09-21 ENCOUNTER — Ambulatory Visit: Payer: PRIVATE HEALTH INSURANCE

## 2018-03-18 ENCOUNTER — Encounter (HOSPITAL_COMMUNITY): Payer: Self-pay

## 2018-03-18 ENCOUNTER — Emergency Department (HOSPITAL_COMMUNITY)
Admission: EM | Admit: 2018-03-18 | Discharge: 2018-03-18 | Disposition: A | Discharge status: Home / Self Care | Payer: PRIVATE HEALTH INSURANCE | Attending: Emergency Medicine | Admitting: Emergency Medicine

## 2018-03-18 DIAGNOSIS — W260XXA Contact with knife, initial encounter: Secondary | ICD-10-CM | POA: Insufficient documentation

## 2018-03-18 DIAGNOSIS — Y999 Unspecified external cause status: Secondary | ICD-10-CM | POA: Insufficient documentation

## 2018-03-18 DIAGNOSIS — Y9389 Activity, other specified: Secondary | ICD-10-CM | POA: Insufficient documentation

## 2018-03-18 DIAGNOSIS — Z79899 Other long term (current) drug therapy: Secondary | ICD-10-CM | POA: Diagnosis not present

## 2018-03-18 DIAGNOSIS — Y929 Unspecified place or not applicable: Secondary | ICD-10-CM | POA: Insufficient documentation

## 2018-03-18 DIAGNOSIS — S61012A Laceration without foreign body of left thumb without damage to nail, initial encounter: Secondary | ICD-10-CM | POA: Diagnosis not present

## 2018-03-18 DIAGNOSIS — Z23 Encounter for immunization: Secondary | ICD-10-CM | POA: Insufficient documentation

## 2018-03-18 DIAGNOSIS — S6992XA Unspecified injury of left wrist, hand and finger(s), initial encounter: Secondary | ICD-10-CM | POA: Diagnosis present

## 2018-03-18 MED ORDER — LIDOCAINE-EPINEPHRINE (PF) 2 %-1:200000 IJ SOLN
10.0000 mL | Freq: Once | INTRAMUSCULAR | Status: AC
  Administered 2018-03-18: 10 mL
  Filled 2018-03-18: qty 20

## 2018-03-18 MED ORDER — TETANUS-DIPHTH-ACELL PERTUSSIS 5-2.5-18.5 LF-MCG/0.5 IM SUSP
0.5000 mL | Freq: Once | INTRAMUSCULAR | Status: AC
  Administered 2018-03-18: 0.5 mL via INTRAMUSCULAR
  Filled 2018-03-18: qty 0.5

## 2018-03-18 NOTE — ED Provider Notes (Signed)
[facility] EMERGENCY DEPARTMENT Provider Note   CSN: [id number] Arrival date & time: 03/18/18  2058    History   Chief Complaint Chief Complaint  Patient presents with  . Laceration    HPI Nicholas Howell is a 53 y.o. male presenting for evaluation of thumb laceration.  Patient states approximately 2 hours prior to arrival he was cutting something off his motorcycle when the knife slipped and cut his left thumb.  He reports minimal to no pain.  He tried using an over-the-counter powder as well as pressure to stop the bleeding, but bleeding has been persistent.  He denies numbness or tingling.  He takes no medications daily, is not on blood thinners.  His tetanus is not up-to-date.     HPI  Past Medical History:  Diagnosis Date  . Arthritis     Patient Active Problem List   Diagnosis Date Noted  . 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 Other bilateral secondary osteoarthritis of knee 07/14/2017  . Fatigue 07/14/2017  . Screen for colon cancer 06/17/2016  . Morbid obesity (HCC) 06/17/2016  . Primary osteoarthritis of both knees 02/27/2016    Past Surgical History:  Procedure Laterality Date  . WRIST SURGERY          Home Medications    Prior to Admission medications   Medication Sig Start Date End Date Taking? Authorizing Provider  testosterone cypionate (DEPOTESTOSTERONE CYPIONATE) 200 MG/ML injection Inject 1 mL (200 mg total) into the muscle every 14 (fourteen) days. 07/22/17   Mliss Sax, MD    Family History Family History  Problem Relation Age of Onset  . Diabetes Mother     Social History Social History   Tobacco Use  . Smoking status: Never Smoker  . Smokeless tobacco: Never Used  Substance Use Topics  . Alcohol use: No  . Drug use: No     Allergies   Patient has no known allergies.   Review of Systems Review of Systems  Skin: Positive for wound.  Neurological: Negative for numbness.     Physical  Exam Updated Vital Signs BP 137/87 (BP Location: Right Arm)   Pulse 90   Temp 97.8 F (36.6 C) (Oral)   Resp 16   SpO2 97%   Physical Exam Vitals signs and nursing note reviewed.  Constitutional:      General: He is not in acute distress.    Appearance: He is well-developed.  HENT:     Head: Normocephalic and atraumatic.  Neck:     Musculoskeletal: Normal range of motion.  Pulmonary:     Effort: Pulmonary effort is normal.  Abdominal:     General: There is no distension.  Musculoskeletal: Normal range of motion.     Comments: Full active range of motion of the thumb without difficulty.  Strength against resistance intact.  Good distal cap refill.  Sensation of distal thumb intact.  Skin:    General: Skin is warm.     Capillary Refill: Capillary refill takes less than 2 seconds.     Findings: No rash.     Comments: 1 cm lack to the dorsal aspect of the left thumb with minimal oozing.  Laceration approximately 1 to 2 mm deep.  Neurological:     Mental Status: He is alert and oriented to person, place, and time.      ED Treatments / Results  Labs (all labs ordered are listed, but only abnormal results are displayed) Labs Reviewed - No data to  display  EKG None  Radiology No results found.  Procedures .Marland KitchenLaceration Repair Date/Time: 03/18/2018 10:15 PM Performed by: Alveria Apley, PA-C Authorized by: Alveria Apley, PA-C   Consent:    Consent obtained:  Verbal   Consent given by:  Patient   Risks discussed:  Infection, pain, poor cosmetic result, poor wound healing and need for additional repair Anesthesia (see MAR for exact dosages):    Anesthesia method:  Local infiltration   Local anesthetic:  Lidocaine 2% WITH epi Laceration details:    Location:  Finger   Finger location:  L thumb   Length (cm):  1   Depth (mm):  2 Repair type:    Repair type:  Simple Pre-procedure details:    Preparation:  Patient was prepped and draped in usual sterile  fashion Exploration:    Hemostasis achieved with:  Direct pressure and tourniquet   Wound exploration: wound explored through full range of motion     Wound extent: no foreign bodies/material noted, no nerve damage noted, no tendon damage noted and no vascular damage noted   Treatment:    Area cleansed with:  Saline   Amount of cleaning:  Standard   Irrigation solution:  Sterile saline   Irrigation method:  Pressure wash Skin repair:    Repair method:  Sutures   Suture size:  4-0   Suture material:  Prolene   Suture technique:  Simple interrupted   Number of sutures:  4 Approximation:    Approximation:  Close Post-procedure details:    Dressing:  Bulky dressing   Patient tolerance of procedure:  Tolerated well, no immediate complications   (including critical care time)  Medications Ordered in ED Medications  lidocaine-EPINEPHrine (XYLOCAINE W/EPI) 2 %-1:200000 (PF) injection 10 mL (10 mLs Infiltration Given by Other 03/18/18 2143)  Tdap (BOOSTRIX) injection 0.5 mL (0.5 mLs Intramuscular Given 03/18/18 2142)     Initial Impression / Assessment and Plan / ED Course  I have reviewed the triage vital signs and the nursing notes.  Pertinent labs & imaging results that were available during my care of the patient were reviewed by me and considered in my medical decision making (see chart for details).        Patient presenting for evaluation of left thumb laceration.  Physical exam reassuring, he is neurovascularly intact.  Laceration repaired as described above.  Tetanus updated.  Aftercare instructions given.  Patient to return in 7 to 10 days for suture removal.  At this time, patient appears safe for discharge.  Return precautions given.  Patient states he understands and agrees to plan.  Final Clinical Impressions(s) / ED Diagnoses   Final diagnoses:  Laceration of left thumb without foreign body without damage to nail, initial encounter    ED Discharge Orders    None        Alveria Apley, PA-C 03/18/18 2216    Rolan Bucco, MD 03/18/18 2323

## 2018-03-18 NOTE — ED Triage Notes (Signed)
Cut left thumb with knife.  Bleeding.

## 2018-03-18 NOTE — Discharge Instructions (Addendum)
1. Medications: Tylenol or ibuprofen for pain, usual home medications 2. Treatment: ice for swelling, keep wound clean with warm soap and water and keep bandage dry 3. Follow Up: Please return in 7-10 days to have your stitches removed or sooner if you have concerns. Return to the emergency department for increased redness, drainage of pus from the wound   WOUND CARE  Remove bandage and wash wound gently with mild soap and warm water daily. Reapply a new bandage after cleaning wound  Do not apply any ointments or creams to the wound while stitches are in place, as this may cause delayed healing. Return if you experience any of the following signs of infection: Swelling, redness, pus drainage, streaking, fever >101.0 F  Return if you experience excessive bleeding that does not stop after 15-20 minutes of constant, firm pressure.

## 2018-03-27 ENCOUNTER — Encounter (HOSPITAL_COMMUNITY): Payer: Self-pay | Admitting: Emergency Medicine

## 2018-03-27 ENCOUNTER — Ambulatory Visit (HOSPITAL_COMMUNITY)
Admission: EM | Admit: 2018-03-27 | Discharge: 2018-03-27 | Disposition: A | Discharge status: Home / Self Care | Payer: PRIVATE HEALTH INSURANCE

## 2018-03-27 NOTE — ED Triage Notes (Signed)
Pt here for suture removal to left thumb; 4 sutures noted and wound approximated

## 2018-05-11 ENCOUNTER — Telehealth: Payer: Self-pay | Admitting: Family Medicine

## 2018-05-11 NOTE — Telephone Encounter (Signed)
Called patient on behalf of Dr Doreene Burke because it has been a little bit since last visit, left message to see if he would be interested in a virtual visit.

## 2018-11-26 IMAGING — CR DG KNEE COMPLETE 4+V*L*
4 series · 4 of 4 positions shown · non-contrast
Comparison: None.

CLINICAL DATA: 51-year-old male with a history of fall and knee
pain

EXAM:
LEFT KNEE - COMPLETE 4+ VIEW

[t knee ap left]
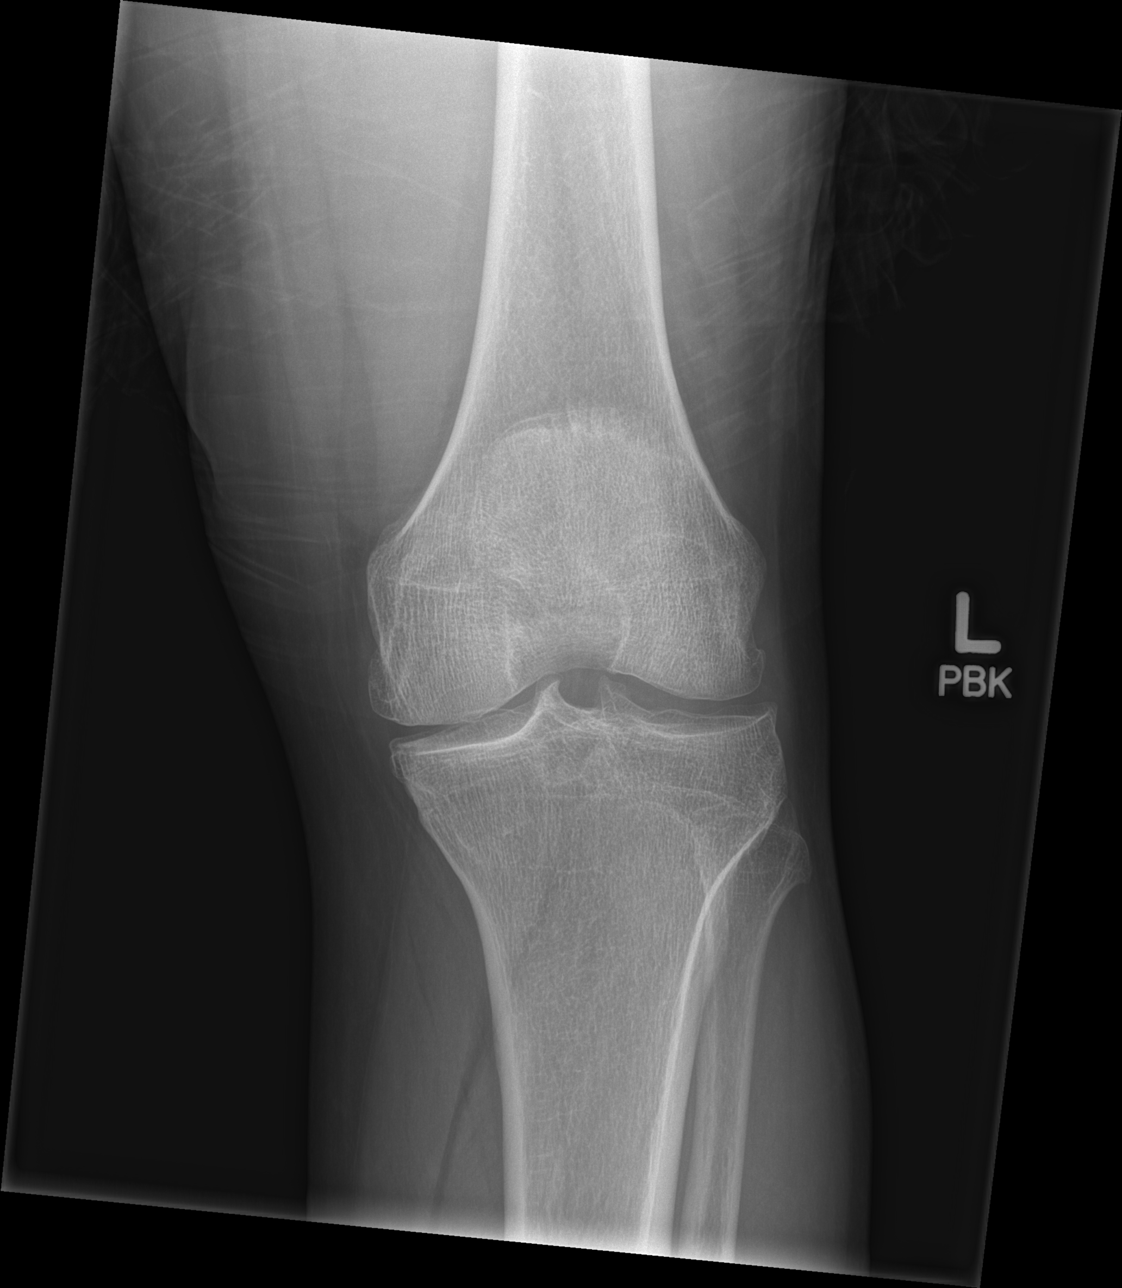

[t knee obl left (1 of 2)]
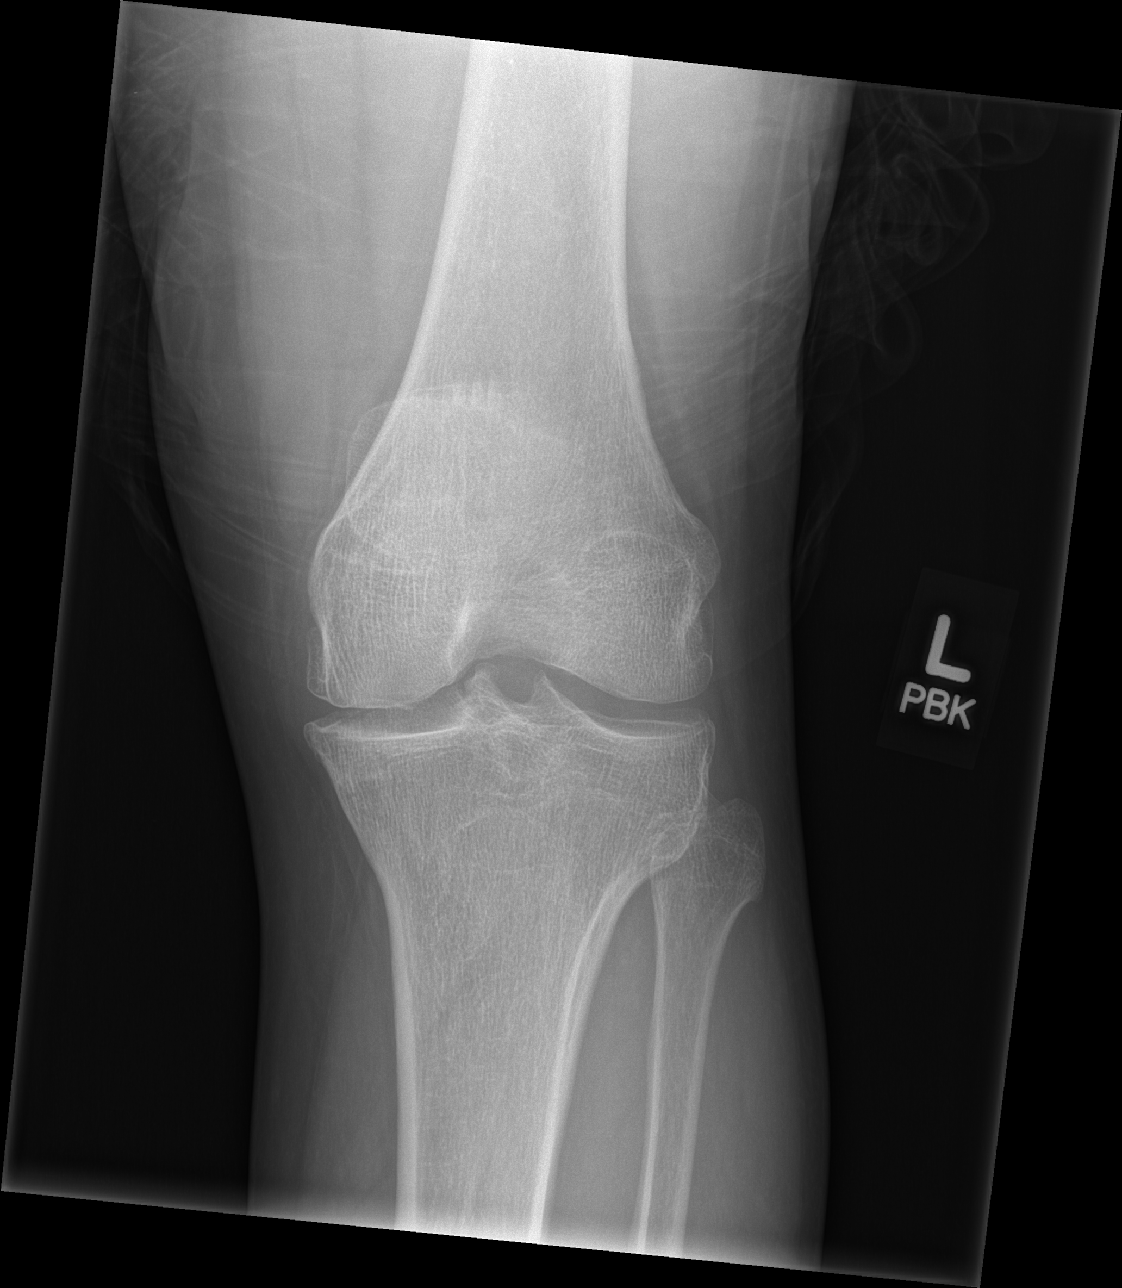

[t knee obl left (2 of 2)]
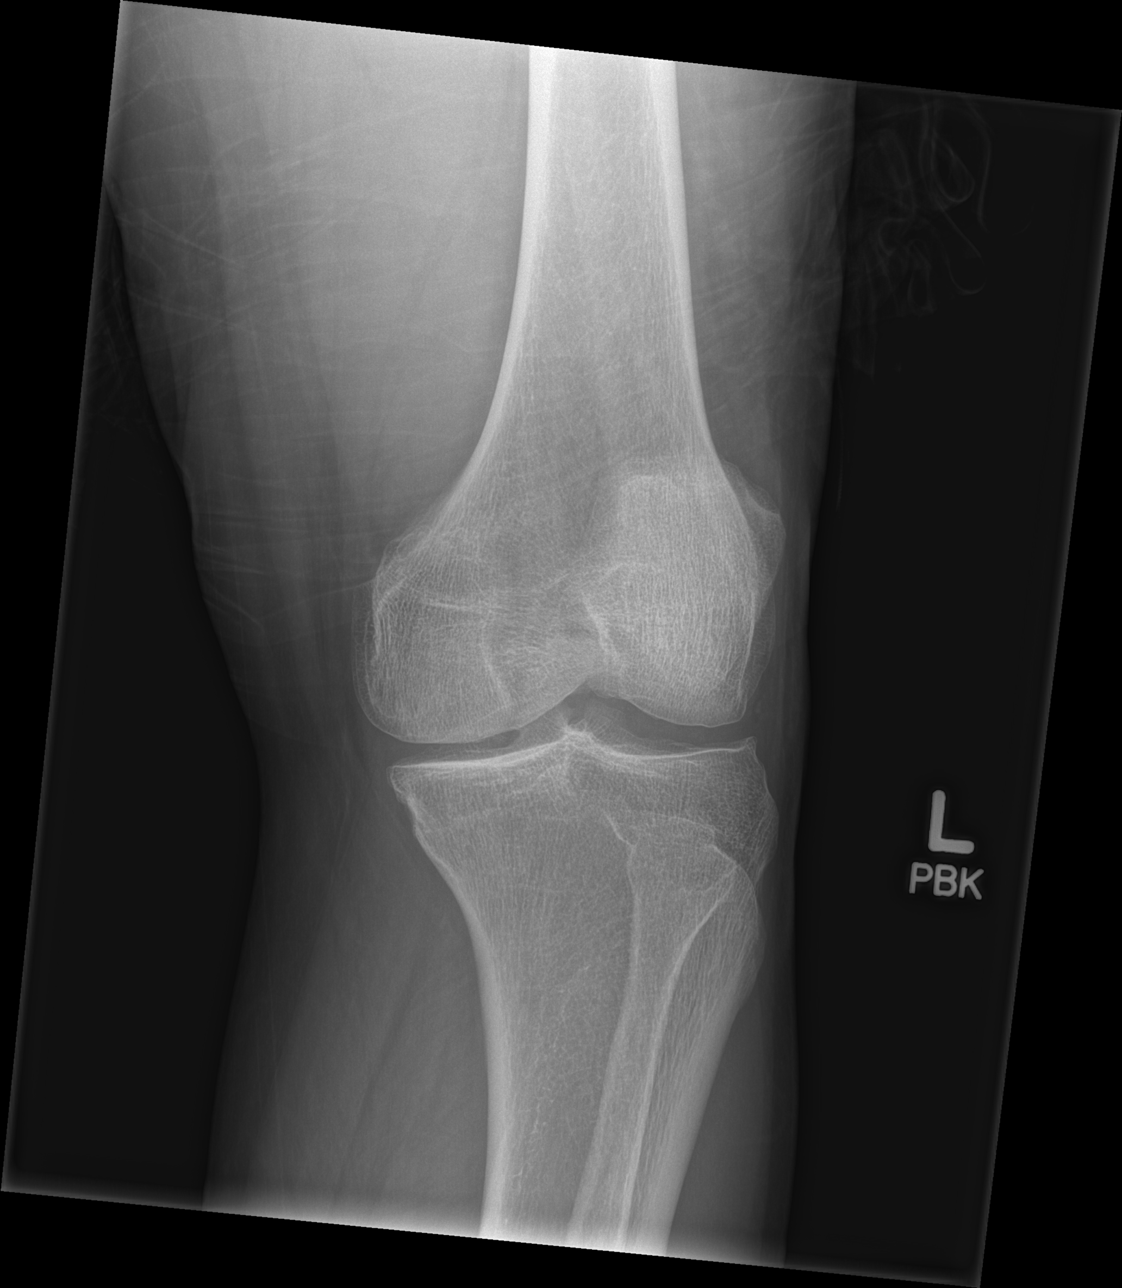

[t knee lat left]
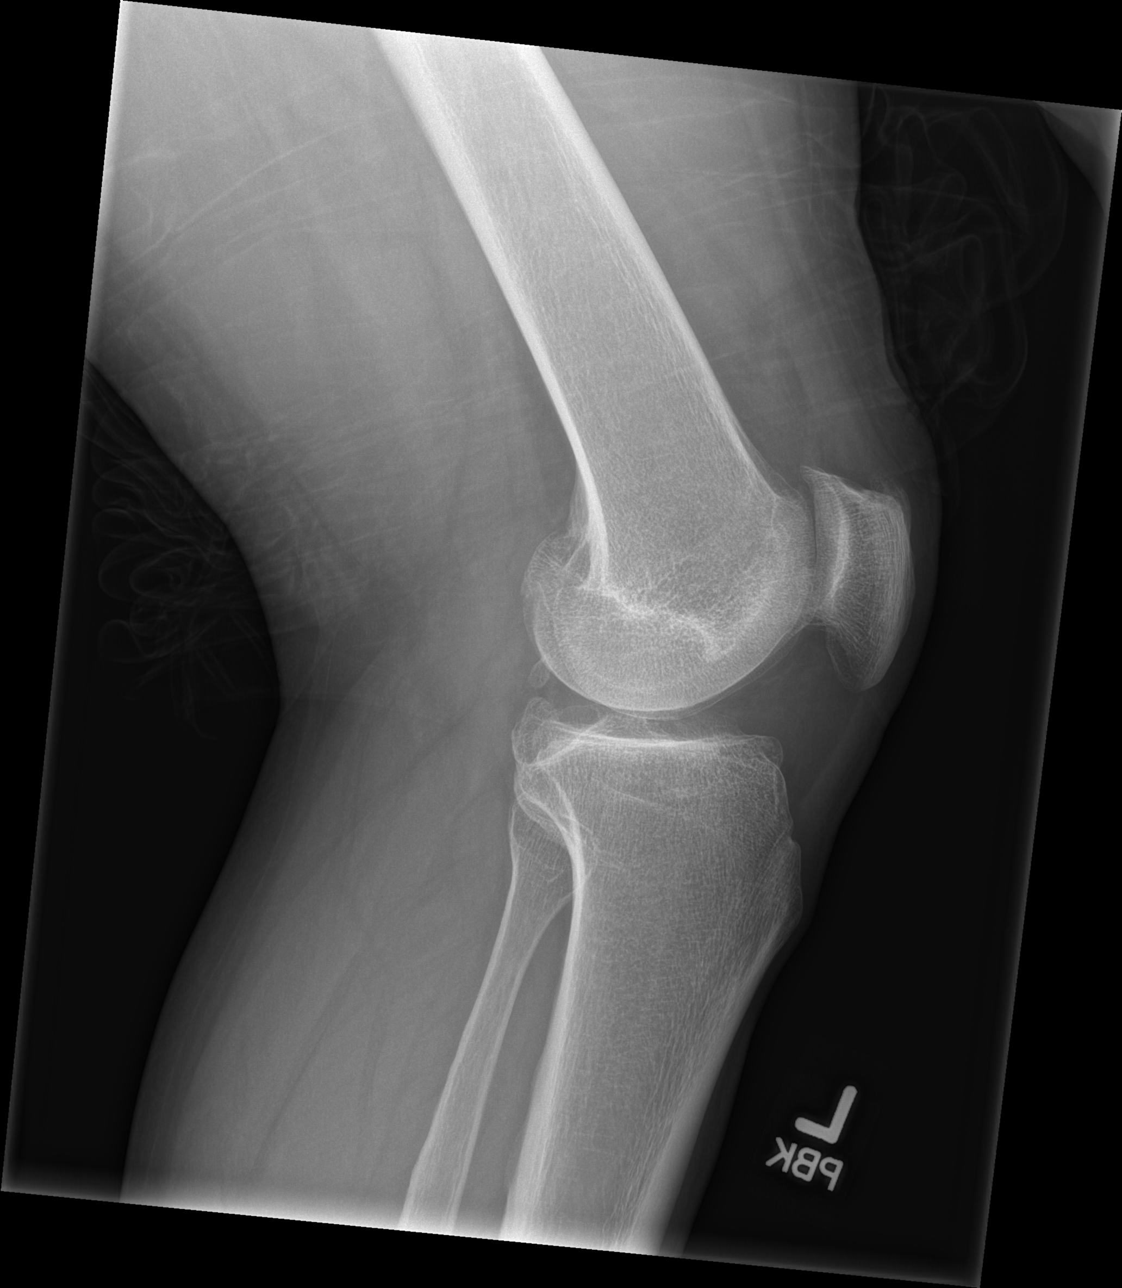

[4 of 4 positions shown; findings below may reference images not displayed]

FINDINGS: No acute displaced fracture.

No joint effusion.

Medial and lateral joint space narrowing with marginal osteophyte
formation. Degenerative changes at the patellofemoral joint.
IMPRESSION: Negative for acute bony abnormality.

Tricompartmental osteoarthritis

## 2020-06-19 ENCOUNTER — Other Ambulatory Visit: Payer: Self-pay

## 2020-06-20 ENCOUNTER — Ambulatory Visit (INDEPENDENT_AMBULATORY_CARE_PROVIDER_SITE_OTHER): Payer: PRIVATE HEALTH INSURANCE | Admitting: Family Medicine

## 2020-06-20 ENCOUNTER — Encounter: Payer: Self-pay | Admitting: Family Medicine

## 2020-06-20 VITALS — BP 118/78 | HR 83 | Temp 97.0°F | Ht 73.0 in | Wt 279.0 lb

## 2020-06-20 DIAGNOSIS — Z0000 Encounter for general adult medical examination without abnormal findings: Secondary | ICD-10-CM | POA: Diagnosis not present

## 2020-06-20 LAB — CBC: MCHC: 32.7 g/dL (ref 32.0–36.0)

## 2020-06-20 NOTE — Patient Instructions (Signed)
BMI for Adults What is BMI? Body mass index (BMI) is a number that is calculated from a person's weight and height. BMI can help estimate how much of a person's weight is composed of fat. BMI does not measure body fat directly. Rather, it is an alternative to procedures that directly measure body fat, which can be difficult and expensive. BMI can help identify people who may be at higher risk for certain medical problems. What are BMI measurements used for? BMI is used as a screening tool to identify possible weight problems. It helps determine whether a person is obese, overweight, a healthy weight, or underweight. BMI is useful for:  Identifying a weight problem that may be related to a medical condition or may increase the risk for medical problems.  Promoting changes, such as changes in diet and exercise, to help reach a healthy weight. BMI screening can be repeated to see if these changes are working. How is BMI calculated? BMI involves measuring your weight in relation to your height. Both height and weight are measured, and the BMI is calculated from those numbers. This can be done either in [place] ([place] or metric measurements. Note that charts and online BMI calculators are available to help you find your BMI quickly and easily without having to do these calculations yourself. To calculate your BMI in [place] ([place] measurements: 1. Measure your weight in pounds (lb). 2. Multiply the number of pounds by 703. ? For example, for a person who weighs 180 lb, multiply that number by 703, which equals 126,540. 3. Measure your height in inches. Then multiply that number by itself to get a measurement called "inches squared." ? For example, for a person who is 70 inches tall, the "inches squared" measurement is 70 inches x 70 inches, which equals 4,900 inches squared. 4. Divide the total from step 2 (number of lb x 703) by the total from step 3 (inches squared): 126,540  4,900 = 25.8. This is  your BMI.   To calculate your BMI in metric measurements: 1. Measure your weight in kilograms (kg). 2. Measure your height in meters (m). Then multiply that number by itself to get a measurement called "meters squared." ? For example, for a person who is 1.75 m tall, the "meters squared" measurement is 1.75 m x 1.75 m, which is equal to 3.1 meters squared. 3. Divide the number of kilograms (your weight) by the meters squared number. In this example: 70  3.1 = 22.6. This is your BMI. What do the results mean? BMI charts are used to identify whether you are underweight, normal weight, overweight, or obese. The following guidelines will be used:  Underweight: BMI less than 18.5.  Normal weight: BMI between 18.5 and 24.9.  Overweight: BMI between 25 and 29.9.  Obese: BMI of 30 or above. Keep these notes in mind:  Weight includes both fat and muscle, so someone with a muscular build, such as an athlete, may have a BMI that is higher than 24.9. In cases like these, BMI is not an accurate measure of body fat.  To determine if excess body fat is the cause of a BMI of 25 or higher, further assessments may need to be done by a health care provider.  BMI is usually interpreted in the same way for men and women. Where to find more information For more information about BMI, including tools to quickly calculate your BMI, go to these websites:  Centers for Disease Control and Prevention: http://www.wolf.info/  American Heart Association: www.heart.org  National Heart, Lung, and Blood Institute: PopSteam.is Summary  Body mass index (BMI) is a number that is calculated from a person's weight and height.  BMI may help estimate how much of a person's weight is composed of fat. BMI can help identify those who may be at higher risk for certain medical problems.  BMI can be measured using English measurements or metric measurements.  BMI charts are used to identify whether you are underweight, normal  weight, overweight, or obese. This information is not intended to replace advice given to you by your health care provider. Make sure you discuss any questions you have with your health care provider. Document Revised: 09/27/2018 Document Reviewed: 08/04/2018 Elsevier Patient Education  2021 Elsevier Inc.  Health Maintenance, Male Adopting a healthy lifestyle and getting preventive care are important in promoting health and wellness. Ask your health care provider about:  The right schedule for you to have regular tests and exams.  Things you can do on your own to prevent diseases and keep yourself healthy. What should I know about diet, weight, and exercise? Eat a healthy diet  Eat a diet that includes plenty of vegetables, fruits, low-fat dairy products, and lean protein.  Do not eat a lot of foods that are high in solid fats, added sugars, or sodium.   Maintain a healthy weight Body mass index (BMI) is a measurement that can be used to identify possible weight problems. It estimates body fat based on height and weight. Your health care provider can help determine your BMI and help you achieve or maintain a healthy weight. Get regular exercise Get regular exercise. This is one of the most important things you can do for your health. Most adults should:  Exercise for at least 150 minutes each week. The exercise should increase your heart rate and make you sweat (moderate-intensity exercise).  Do strengthening exercises at least twice a week. This is in addition to the moderate-intensity exercise.  Spend less time sitting. Even light physical activity can be beneficial. Watch cholesterol and blood lipids Have your blood tested for lipids and cholesterol at 56 years of age, then have this test every 5 years. You may need to have your cholesterol levels checked more often if:  Your lipid or cholesterol levels are high.  You are older than 56 years of age.  You are at high risk for  heart disease. What should I know about cancer screening? Many types of cancers can be detected early and may often be prevented. Depending on your health history and family history, you may need to have cancer screening at various ages. This may include screening for:  Colorectal cancer.  Prostate cancer.  Skin cancer.  Lung cancer. What should I know about heart disease, diabetes, and high blood pressure? Blood pressure and heart disease  High blood pressure causes heart disease and increases the risk of stroke. This is more likely to develop in people who have high blood pressure readings, are of African descent, or are overweight.  Talk with your health care provider about your target blood pressure readings.  Have your blood pressure checked: ? Every 3-5 years if you are 37-41 years of age. ? Every year if you are 31 years old or older.  If you are between the ages of 45 and 19 and are a current or former smoker, ask your health care provider if you should have a one-time screening for abdominal aortic aneurysm (AAA). Diabetes Have  regular diabetes screenings. This checks your fasting blood sugar level. Have the screening done:  Once every three years after [id number] if you are at a normal weight and have a low risk for diabetes.  More often and at a younger age if you are overweight or have a high risk for diabetes. What should I know about preventing infection? Hepatitis B If you have a higher risk for hepatitis B, you should be screened for this virus. Talk with your health care provider to find out if you are at risk for hepatitis B infection. Hepatitis C Blood testing is recommended for:  Everyone born from [birth year] through 1966.  Anyone with known risk factors for hepatitis C. Sexually transmitted infections (STIs)  You should be screened each year for STIs, including gonorrhea and chlamydia, if: ? You are sexually active and are younger than 56 years of age. ? You are  older than 56 years of age and your health care provider tells you that you are at risk for this type of infection. ? Your sexual activity has changed since you were last screened, and you are at increased risk for chlamydia or gonorrhea. Ask your health care provider if you are at risk.  Ask your health care provider about whether you are at high risk for HIV. Your health care provider may recommend a prescription medicine to help prevent HIV infection. If you choose to take medicine to prevent HIV, you should first get tested for HIV. You should then be tested every 3 months for as long as you are taking the medicine. Follow these instructions at home: Lifestyle  Do not use any products that contain nicotine or tobacco, such as cigarettes, e-cigarettes, and chewing tobacco. If you need help quitting, ask your health care provider.  Do not use street drugs.  Do not share needles.  Ask your health care provider for help if you need support or information about quitting drugs. Alcohol use  Do not drink alcohol if your health care provider tells you not to drink.  If you drink alcohol: ? Limit how much you have to 0-2 drinks a day. ? Be aware of how much alcohol is in your drink. In the [place] one drink equals one 12 oz bottle of beer (355 mL), one 5 oz glass of wine (148 mL), or one 1 oz glass of hard liquor (44 mL). General instructions  Schedule regular health, dental, and eye exams.  Stay current with your vaccines.  Tell your health care provider if: ? You often feel depressed. ? You have ever been abused or do not feel safe at home. Summary  Adopting a healthy lifestyle and getting preventive care are important in promoting health and wellness.  Follow your health care provider's instructions about healthy diet, exercising, and getting tested or screened for diseases.  Follow your health care provider's instructions on monitoring your cholesterol and blood pressure. This  information is not intended to replace advice given to you by your health care provider. Make sure you discuss any questions you have with your health care provider. Document Revised: 12/28/2017 Document Reviewed: 12/28/2017 Elsevier Patient Education  2021 Elsevier Inc.  Preventive Care 23-56 Years Old, Male Preventive care refers to lifestyle choices and visits with your health care provider that can promote health and wellness. This includes:  A yearly physical exam. This is also called an annual wellness visit.  Regular dental and eye exams.  Immunizations.  Screening for certain conditions.  Healthy lifestyle  choices, such as: ? Eating a healthy diet. ? Getting regular exercise. ? Not using drugs or products that contain nicotine and tobacco. ? Limiting alcohol use. What can I expect for my preventive care visit? Physical exam Your health care provider will check your:  Height and weight. These may be used to calculate your BMI (body mass index). BMI is a measurement that tells if you are at a healthy weight.  Heart rate and blood pressure.  Body temperature.  Skin for abnormal spots. Counseling Your health care provider may ask you questions about your:  Past medical problems.  Family's medical history.  Alcohol, tobacco, and drug use.  Emotional well-being.  Home life and relationship well-being.  Sexual activity.  Diet, exercise, and sleep habits.  Work and work [employer].  Access to firearms. What immunizations do I need? Vaccines are usually given at various ages, according to a schedule. Your health care provider will recommend vaccines for you based on your age, medical history, and lifestyle or other factors, such as travel or where you work.   What tests do I need? Blood tests  Lipid and cholesterol levels. These may be checked every 5 years, or more often if you are over 56 years old.  Hepatitis C test.  Hepatitis B  test. Screening  Lung cancer screening. You may have this screening every year starting at age 56 if you have a 30-pack-year history of smoking and currently smoke or have quit within the past 15 years.  Prostate cancer screening. Recommendations will vary depending on your family history and other risks.  Genital exam to check for testicular cancer or hernias.  Colorectal cancer screening. ? All adults should have this screening starting at age 56 and continuing until age 56. ? Your health care provider may recommend screening at age 56 if you are at increased risk. ? You will have tests every 1-10 years, depending on your results and the type of screening test.  Diabetes screening. ? This is done by checking your blood sugar (glucose) after you have not eaten for a while (fasting). ? You may have this done every 1-3 years.  STD (sexually transmitted disease) testing, if you are at risk. Follow these instructions at home: Eating and drinking  Eat a diet that includes fresh fruits and vegetables, whole grains, lean protein, and low-fat dairy products.  Take vitamin and mineral supplements as recommended by your health care provider.  Do not drink alcohol if your health care provider tells you not to drink.  If you drink alcohol: ? Limit how much you have to 0-2 drinks a day. ? Be aware of how much alcohol is in your drink. In the [place] one drink equals one 12 oz bottle of beer (355 mL), one 5 oz glass of wine (148 mL), or one 1 oz glass of hard liquor (44 mL).   Lifestyle  Take daily care of your teeth and gums. Brush your teeth every morning and night with fluoride toothpaste. Floss one time each day.  Stay active. Exercise for at least 30 minutes 5 or more days each week.  Do not use any products that contain nicotine or tobacco, such as cigarettes, e-cigarettes, and chewing tobacco. If you need help quitting, ask your health care provider.  Do not use drugs.  If you are  sexually active, practice safe sex. Use a condom or other form of protection to prevent STIs (sexually transmitted infections).  If told by your health care provider, take  low-dose aspirin daily starting at age 68.  Find healthy ways to cope with stress, such as: ? Meditation, yoga, or listening to music. ? Journaling. ? Talking to a trusted person. ? Spending time with friends and family. Safety  Always wear your seat belt while driving or riding in a vehicle.  Do not drive: ? If you have been drinking alcohol. Do not ride with someone who has been drinking. ? When you are tired or distracted. ? While texting.  Wear a helmet and other protective equipment during sports activities.  If you have firearms in your house, make sure you follow all gun safety procedures. What's next?  Go to your health care provider once a year for an annual wellness visit.  Ask your health care provider how often you should have your eyes and teeth checked.  Stay up to date on all vaccines. This information is not intended to replace advice given to you by your health care provider. Make sure you discuss any questions you have with your health care provider. Document Revised: 10/03/2018 Document Reviewed: 12/29/2017 Elsevier Patient Education  2021 Elsevier Inc.  Obesity, Adult Obesity is the condition of having too much total body fat. Being overweight or obese means that your weight is greater than what is considered healthy for your body size. Obesity is determined by a measurement called BMI. BMI is an estimate of body fat and is calculated from height and weight. For adults, a BMI of 30 or higher is considered obese. Obesity can lead to other health concerns and major illnesses, including:  Stroke.  Coronary artery disease (CAD).  Type 2 diabetes.  Some types of cancer, including cancers of the colon, breast, uterus, and gallbladder.  Osteoarthritis.  High blood pressure  (hypertension).  High cholesterol.  Sleep apnea.  Gallbladder stones.  Infertility problems. What are the causes? Common causes of this condition include:  Eating daily meals that are high in calories, sugar, and fat.  Being born with genes that may make you more likely to become obese.  Having a medical condition that causes obesity, including: ? Hypothyroidism. ? Polycystic ovarian syndrome (PCOS). ? Binge-eating disorder. ? Cushing syndrome.  Taking certain medicines, such as steroids, antidepressants, and seizure medicines.  Not being physically active (sedentary lifestyle).  Not getting enough sleep.  Drinking high amounts of sugar-sweetened beverages, such as soft drinks. What increases the risk? The following factors may make you more likely to develop this condition:  Having a family history of obesity.  Being a woman of African American descent.  Being a man of Hispanic descent.  Living in an area with limited access to: ? [place] [facility], recreation centers, or sidewalks. ? Healthy food choices, such as grocery stores and farmers' markets. What are the signs or symptoms? The main sign of this condition is having too much body fat. How is this diagnosed? This condition is diagnosed based on:  Your BMI. If you are an adult with a BMI of 30 or higher, you are considered obese.  Your waist circumference. This measures the distance around your waistline.  Your skinfold thickness. Your health care provider may gently pinch a fold of your skin and measure it. You may have other tests to check for underlying conditions. How is this treated? Treatment for this condition often includes changing your lifestyle. Treatment may include some or all of the following:  Dietary changes. This may include developing a healthy meal plan.  Regular physical activity. This may include activity that  causes your heart to beat faster (aerobic exercise) and strength training. Work with  your health care provider to design an exercise program that works for you.  Medicine to help you lose weight if you are unable to lose 1 pound a week after 6 weeks of healthy eating and more physical activity.  Treating conditions that cause the obesity (underlying conditions).  Surgery. Surgical options may include gastric banding and gastric bypass. Surgery may be done if: ? Other treatments have not helped to improve your condition. ? You have a BMI of 40 or higher. ? You have life-threatening health problems related to obesity. Follow these instructions at home: Eating and drinking  Follow recommendations from your health care provider about what you eat and drink. Your health care provider may advise you to: ? Limit fast food, sweets, and processed snack foods. ? Choose low-fat options, such as low-fat milk instead of whole milk. ? Eat 5 or more servings of fruits or vegetables every day. ? Eat at home more often. This gives you more control over what you eat. ? Choose healthy foods when you eat out. ? Learn to read food labels. This will help you understand how much food is considered 1 serving. ? Learn what a healthy serving size is. ? Keep low-fat snacks available. ? Limit sugary drinks, such as soda, fruit juice, sweetened iced tea, and flavored milk.  Drink enough water to keep your urine pale yellow.  Do not follow a fad diet. Fad diets can be unhealthy and even dangerous.   Physical activity  Exercise regularly, as told by your health care provider. ? Most adults should get up to 150 minutes of moderate-intensity exercise every week. ? Ask your health care provider what types of exercise are safe for you and how often you should exercise.  Warm up and stretch before being active.  Cool down and stretch after being active.  Rest between periods of activity. Lifestyle  Work with your health care provider and a dietitian to set a weight-loss goal that is healthy and  reasonable for you.  Limit your screen time.  Find ways to reward yourself that do not involve food.  Do not drink alcohol if: ? Your health care provider tells you not to drink. ? You are pregnant, may be pregnant, or are planning to become pregnant.  If you drink alcohol: ? Limit how much you use to:  0-1 drink a day for women.  0-2 drinks a day for men. ? Be aware of how much alcohol is in your drink. In the [place] one drink equals one 12 oz bottle of beer (355 mL), one 5 oz glass of wine (148 mL), or one 1 oz glass of hard liquor (44 mL). General instructions  Keep a weight-loss journal to keep track of the food you eat and how much exercise you get.  Take over-the-counter and prescription medicines only as told by your health care provider.  Take vitamins and supplements only as told by your health care provider.  Consider joining a support group. Your health care provider may be able to recommend a support group.  Keep all follow-up visits as told by your health care provider. This is important. Contact a health care provider if:  You are unable to meet your weight loss goal after 6 weeks of dietary and lifestyle changes. Get help right away if you are having:  Trouble breathing.  Suicidal thoughts or behaviors. Summary  Obesity is the condition of  having too much total body fat.  Being overweight or obese means that your weight is greater than what is considered healthy for your body size.  Work with your health care provider and a dietitian to set a weight-loss goal that is healthy and reasonable for you.  Exercise regularly, as told by your health care provider. Ask your health care provider what types of exercise are safe for you and how often you should exercise. This information is not intended to replace advice given to you by your health care provider. Make sure you discuss any questions you have with your health care provider. Document Revised: 09/08/2017  Document Reviewed: 09/08/2017 Elsevier Patient Education  2021 [employer].

## 2020-06-20 NOTE — Progress Notes (Signed)
Established Patient Office Visit  Subjective:  Patient ID: Nicholas Howell, male    DOB: [birth year]  Age: 55 y.o. MRN: [id number]  CC:  Chief Complaint  Patient presents with  . Annual Exam    CPE, no concerns. Patient would like referral for colonoscopy.     HPI Nicholas Howell presents for health check and physical exam.  He has been doing well.  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  He stays active by walking and riding his motorcycle.  Unfortunately his knees seem to be gradually worsening.  He is seeing orthopedics.  At some point he may need replacements in both knees.  He is not smoking drinking alcohol or use illicit drugs.  Past Medical History:  Diagnosis Date  . Arthritis     Past Surgical History:  Procedure Laterality Date  . WRIST SURGERY      Family History  Problem Relation Age of Onset  . Diabetes Mother     Social History   Socioeconomic History  . Marital status: Married    Spouse name: Not on file  . Number of children: 2  . Years of education: 68  . Highest education level: Not on file  Occupational History  . Occupation: [employer] doctor  Tobacco Use  . Smoking status: Never Smoker  . Smokeless tobacco: Never Used  Vaping Use  . Vaping Use: Never used  Substance and Sexual Activity  . Alcohol use: No  . Drug use: No  . Sexual activity: Yes  Other Topics Concern  . Not on file  Social History Narrative   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   Social Determinants of Health   Financial Resource Strain: Not on file  Food Insecurity: Not on file  Transportation Needs: Not on file  Physical Activity: Not on file  Stress: Not on file  Social Connections: Not on file  Intimate Partner Violence: Not on file    Outpatient Medications Prior to Visit  Medication Sig Dispense Refill  . testosterone cypionate (DEPOTESTOSTERONE CYPIONATE) 200 MG/ML injection Inject 1 mL (200 mg total) into the muscle every 14 (fourteen) days.  (Patient not taking: Reported on 06/20/2020) 10 mL 0   No facility-administered medications prior to visit.    No Known Allergies  ROS Review of Systems  Constitutional: Negative.   HENT: Negative.   Eyes: Negative for photophobia and visual disturbance.  Respiratory: Negative.   Cardiovascular: Negative.   Gastrointestinal: Negative.   Endocrine: Negative for polyphagia.  Genitourinary: Negative for difficulty urinating, frequency and urgency.  Musculoskeletal: Positive for arthralgias and gait problem.  Neurological: Negative for weakness and numbness.  Psychiatric/Behavioral: Negative.       Objective:    Physical Exam Vitals and nursing note reviewed.  Constitutional:      General: He is not in acute distress.    Appearance: Normal appearance. He is obese. He is not ill-appearing, toxic-appearing or diaphoretic.  HENT:     Head: Normocephalic and atraumatic.     Right Ear: Tympanic membrane, ear canal and external ear normal.     Left Ear: Tympanic membrane, ear canal and external ear normal.     Mouth/Throat:     Mouth: Mucous membranes are moist.     Pharynx: Oropharynx is clear. No oropharyngeal exudate or posterior oropharyngeal erythema.  Eyes:     General: No scleral icterus.       Right eye: No discharge.  Left eye: No discharge.     Extraocular Movements: Extraocular movements intact.     Conjunctiva/sclera: Conjunctivae normal.     Pupils: Pupils are equal, round, and reactive to light.  Cardiovascular:     Rate and Rhythm: Normal rate and regular rhythm.  Pulmonary:     Effort: Pulmonary effort is normal.     Breath sounds: Normal breath sounds.  Abdominal:     General: Abdomen is flat. Bowel sounds are normal. There is no distension.     Palpations: There is no mass.     Tenderness: There is no abdominal tenderness. There is no guarding or rebound.     Hernia: No hernia is present.  Genitourinary:    Prostate: Enlarged. Not tender and no  nodules present.     Rectum: Guaiac result negative. No mass, tenderness, anal fissure, external hemorrhoid or internal hemorrhoid. Normal anal tone.  Musculoskeletal:     Cervical back: Normal range of motion. No rigidity or tenderness.  Lymphadenopathy:     Cervical: No cervical adenopathy.  Skin:    General: Skin is warm and dry.  Neurological:     Mental Status: He is alert and oriented to person, place, and time.  Psychiatric:        Mood and Affect: Mood normal.        Behavior: Behavior normal.     BP 118/78   Pulse 83   Temp (!) 97 F (36.1 C) (Temporal)   Ht 6\' 1"  (1.854 m)   Wt 279 lb (126.6 kg)   SpO2 96%   BMI 36.81 kg/m  Wt Readings from Last 3 Encounters:  06/20/20 279 lb (126.6 kg)  07/14/17 271 lb (122.9 kg)  01/24/17 270 lb (122.5 kg)     Health Maintenance Due  Topic Date Due  . Hepatitis C Screening  Never done  . COLONOSCOPY (Pts 45-69yrs Insurance coverage will need to be confirmed)  Never done  . Zoster Vaccines- Shingrix (1 of 2) Never done    There are no preventive care reminders to display for this patient.  Lab Results  Component Value Date   TSH 0.67 07/14/2017   Lab Results  Component Value Date   WBC 6.4 07/14/2017   HGB 15.0 07/14/2017   HCT 45.7 07/14/2017   MCV 90.5 07/14/2017   PLT 225.0 07/14/2017   Lab Results  Component Value Date   NA 139 07/14/2017   K 4.4 07/14/2017   CO2 30 07/14/2017   GLUCOSE 95 07/14/2017   BUN 13 07/14/2017   CREATININE 0.87 07/14/2017   BILITOT 0.4 07/14/2017   ALKPHOS 53 07/14/2017   AST 45 (H) 07/14/2017   ALT 51 07/14/2017   PROT 7.6 07/14/2017   ALBUMIN 4.3 07/14/2017   CALCIUM 10.0 07/14/2017   ANIONGAP 6 10/03/2014   GFR 118.25 07/14/2017   Lab Results  Component Value Date   CHOL 184 07/14/2017   Lab Results  Component Value Date   HDL 36.80 (L) 07/14/2017   Lab Results  Component Value Date   LDLCALC 121 (H) 07/14/2017   Lab Results  Component Value Date   TRIG  133.0 07/14/2017   Lab Results  Component Value Date   CHOLHDL 5 07/14/2017   No results found for: HGBA1C    Assessment & Plan:   Problem List Items Addressed This Visit      Other   Morbid obesity (HCC)   Relevant Orders   VITAMIN D 25 Hydroxy (Vit-D Deficiency, Fractures)  Other Visit Diagnoses    Healthcare maintenance    -  Primary   Relevant Orders   CBC   Comprehensive metabolic panel   Lipid panel   Urinalysis, Routine w reflex microscopic   Hemoglobin A1c   VITAMIN D 25 Hydroxy (Vit-D Deficiency, Fractures)   PSA   Ambulatory referral to Gastroenterology      No orders of the defined types were placed in this encounter.   Follow-up: Return in about 1 year (around 06/20/2021).  Given information on health maintenance and disease prevention.  Also discussed losing weight.  Given information on BMI and obesity.  Currently under the care of orthopedics for his knees.  He plans to put off replacement as long as possible.  Mliss Sax, MD

## 2020-06-21 LAB — LIPID PANEL
Cholesterol: 178 mg/dL (ref <200)
HDL: 33 mg/dL — ABNORMAL LOW (ref >=40)
LDL Cholesterol (Calc): 117 mg/dL (calc) — ABNORMAL HIGH
Non-HDL Cholesterol (Calc): 145 mg/dL (calc) — ABNORMAL HIGH (ref <130)
Total CHOL/HDL Ratio: 5.4 (calc) — ABNORMAL HIGH (ref <5.0)
Triglycerides: 160 mg/dL — ABNORMAL HIGH (ref <150)

## 2020-06-21 LAB — COMPREHENSIVE METABOLIC PANEL
AG Ratio: 1.3 (calc) (ref 1.0–2.5)
ALT: 63 U/L — ABNORMAL HIGH (ref 9–46)
AST: 66 U/L — ABNORMAL HIGH (ref 10–35)
Albumin: 4.4 g/dL (ref 3.6–5.1)
Alkaline phosphatase (APISO): 59 U/L (ref 35–144)
BUN: 10 mg/dL (ref 7–25)
CO2: 25 mmol/L (ref 20–32)
Calcium: 9.8 mg/dL (ref 8.6–10.3)
Chloride: 102 mmol/L (ref 98–110)
Creat: 0.83 mg/dL (ref 0.70–1.33)
Globulin: 3.4 g/dL (calc) (ref 1.9–3.7)
Glucose, Bld: 76 mg/dL (ref 65–99)
Potassium: 4.3 mmol/L (ref 3.5–5.3)
Sodium: 140 mmol/L (ref 135–146)
Total Bilirubin: 0.3 mg/dL (ref 0.2–1.2)
Total Protein: 7.8 g/dL (ref 6.1–8.1)

## 2020-06-21 LAB — TIQ-MISC

## 2020-06-21 LAB — PSA: PSA: 0.67 ng/mL (ref <=4.00)

## 2020-06-21 LAB — CBC
HCT: 46.2 % (ref 38.5–50.0)
Hemoglobin: 15.1 g/dL (ref 13.2–17.1)
MCH: 29.2 pg (ref 27.0–33.0)
MCV: 89.2 fL (ref 80.0–100.0)
MPV: 12.3 fL (ref 7.5–12.5)
Platelets: 247 10*3/uL (ref 140–400)
RBC: 5.18 10*6/uL (ref 4.20–5.80)
RDW: 12.4 % (ref 11.0–15.0)
WBC: 8 10*3/uL (ref 3.8–10.8)

## 2020-06-21 LAB — HEMOGLOBIN A1C
Hgb A1c MFr Bld: 6.3 % of total Hgb — ABNORMAL HIGH (ref <5.7)
Mean Plasma Glucose: 134 mg/dL
eAG (mmol/L): 7.4 mmol/L

## 2020-06-21 LAB — VITAMIN D 25 HYDROXY (VIT D DEFICIENCY, FRACTURES): Vit D, 25-Hydroxy: 19 ng/mL — ABNORMAL LOW (ref 30–100)

## 2020-06-23 NOTE — Progress Notes (Signed)
There are multiple lab issues that we need to discuss. Please schedule for follow up. Virtual okay.

## 2020-06-26 ENCOUNTER — Telehealth: Payer: Self-pay

## 2020-06-26 NOTE — Telephone Encounter (Signed)
Appointment scheduled VV to discuss labs

## 2020-06-26 NOTE — Telephone Encounter (Signed)
-----   Message from Mliss Sax, MD sent at 06/23/2020  7:33 AM EDT ----- There are multiple lab issues that we need to discuss. Please schedule for follow up. Virtual okay.

## 2020-07-01 ENCOUNTER — Encounter: Payer: Self-pay | Admitting: Family Medicine

## 2020-07-01 ENCOUNTER — Telehealth (INDEPENDENT_AMBULATORY_CARE_PROVIDER_SITE_OTHER): Payer: PRIVATE HEALTH INSURANCE | Admitting: Family Medicine

## 2020-07-01 VITALS — Ht 73.0 in | Wt 279.0 lb

## 2020-07-01 DIAGNOSIS — E119 Type 2 diabetes mellitus without complications: Secondary | ICD-10-CM | POA: Diagnosis not present

## 2020-07-01 DIAGNOSIS — R748 Abnormal levels of other serum enzymes: Secondary | ICD-10-CM | POA: Diagnosis not present

## 2020-07-01 MED ORDER — METFORMIN HCL ER 500 MG PO TB24: 500.0000 mg | ORAL_TABLET | Freq: Every day | ORAL | 1 refills | Status: DC

## 2020-07-01 NOTE — Progress Notes (Signed)
Established Patient Office Visit  Subjective:  Patient ID: Nicholas Howell, male    DOB: 08/13/1965  Age: 55 y.o. MRN: [id number]  CC:  Chief Complaint  Patient presents with   Advice Only    Discuss labs     HPI [name] presents for for follow-up of his recent blood work that revealed an elevated hemoglobin A1c with a normal fasting glucose, elevated LFTs, elevated triglycerides.  Patient rarely drinks alcohol and if he does it is 1 serving.  He does not use illicit drugs.  He has no history of diabetes.  His mom had diabetes and his sister is currently living with diabetes and doing well.  Patient denies blurred vision.  He has increased frequency of urination because he consumes a lot of water.  He has no history of hepatitis.  Past Medical History:  Diagnosis Date   Arthritis     Past Surgical History:  Procedure Laterality Date   WRIST SURGERY      Family History  Problem Relation Age of Onset   Diabetes Mother     Social History   Socioeconomic History   Marital status: Married    Spouse name: Not on file   Number of children: 2   Years of education: 12   Highest education level: Not on file  Occupational History   Occupation: [employer] doctor  Tobacco Use   Smoking status: Never   Smokeless tobacco: Never  Vaping Use   Vaping Use: Never used  Substance and Sexual Activity   Alcohol use: No   Drug use: No   Sexual activity: Yes  Other Topics Concern   Not on file  Social History Narrative   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   Social Determinants of Health   Financial Resource Strain: Not on file  Food Insecurity: Not on file  Transportation Needs: Not on file  Physical Activity: Not on file  Stress: Not on file  Social Connections: Not on file  Intimate Partner Violence: Not on file    No outpatient medications prior to visit.   No facility-administered medications prior to visit.    No Known Allergies  ROS Review of Systems  Constitutional:  Negative.   Eyes:  Negative for visual disturbance.  Respiratory: Negative.    Cardiovascular: Negative.   Gastrointestinal: Negative.   Endocrine: Positive for polyuria. Negative for polyphagia.  Genitourinary: Negative.      Objective:    Physical Exam Nursing note reviewed.  Constitutional:      Appearance: Normal appearance.  HENT:     Head: Normocephalic and atraumatic.  Eyes:     General: No scleral icterus.       Right eye: No discharge.        Left eye: No discharge.     Conjunctiva/sclera: Conjunctivae normal.  Pulmonary:     Effort: Pulmonary effort is normal.  Neurological:     Mental Status: He is alert and oriented to person, place, and time.  Psychiatric:        Mood and Affect: Mood normal.        Behavior: Behavior normal.    Ht 6\' 1"  (1.854 m)   Wt 279 lb (126.6 kg)   BMI 36.81 kg/m  Wt Readings from Last 3 Encounters:  07/01/20 279 lb (126.6 kg)  06/20/20 279 lb (126.6 kg)  07/14/17 271 lb (122.9 kg)     Health Maintenance Due  Topic Date Due   PNEUMOCOCCAL POLYSACCHARIDE VACCINE AGE 13-64 HIGH  RISK  Never done   FOOT EXAM  Never done   OPHTHALMOLOGY EXAM  Never done   Hepatitis C Screening  Never done   COLONOSCOPY (Pts 45-83yrs Insurance coverage will need to be confirmed)  Never done   Zoster Vaccines- Shingrix (1 of 2) Never done    There are no preventive care reminders to display for this patient.  Lab Results  Component Value Date   TSH 0.67 07/14/2017   Lab Results  Component Value Date   WBC 8.0 06/20/2020   HGB 15.1 06/20/2020   HCT 46.2 06/20/2020   MCV 89.2 06/20/2020   PLT 247 06/20/2020   Lab Results  Component Value Date   NA 140 06/20/2020   K 4.3 06/20/2020   CO2 25 06/20/2020   GLUCOSE 76 06/20/2020   BUN 10 06/20/2020   CREATININE 0.83 06/20/2020   BILITOT 0.3 06/20/2020   ALKPHOS 53 07/14/2017   AST 66 (H) 06/20/2020   ALT 63 (H) 06/20/2020   PROT 7.8 06/20/2020   ALBUMIN 4.3 07/14/2017   CALCIUM 9.8  06/20/2020   ANIONGAP 6 10/03/2014   GFR 118.25 07/14/2017   Lab Results  Component Value Date   CHOL 178 06/20/2020   Lab Results  Component Value Date   HDL 33 (L) 06/20/2020   Lab Results  Component Value Date   LDLCALC 117 (H) 06/20/2020   Lab Results  Component Value Date   TRIG 160 (H) 06/20/2020   Lab Results  Component Value Date   CHOLHDL 5.4 (H) 06/20/2020   Lab Results  Component Value Date   HGBA1C 6.3 (H) 06/20/2020      Assessment & Plan:   Problem List Items Addressed This Visit       Endocrine   Type 2 diabetes mellitus without complication, without long-term current use of insulin (HCC) - Primary   Relevant Medications   metFORMIN (GLUCOPHAGE XR) 500 MG 24 hr tablet     Other   Elevated liver enzymes   Relevant Orders   Hepatic function panel   Hepatitis C antibody   Hepatitis B Surface AntiGEN    Meds ordered this encounter  Medications   metFORMIN (GLUCOPHAGE XR) 500 MG 24 hr tablet    Sig: Take 1 tablet (500 mg total) by mouth daily with breakfast.    Dispense:  90 tablet    Refill:  1     Follow-up: Return in about 3 months (around 10/01/2020).  We discussed his diagnosis of diabetes.  We discussed metformin and its side effects.  We discussed limiting carbohydrate intake as 60 g per meal and avoiding all sweets he drinks.  He was given information on carbohydrate counting for diabetes, complementary and alternative medical treatments for diabetes as well as a diabetes action plan.  Discussed the possibility of diabetic education.  He will work on losing weight.  He will return to the clinic for repeat check of his liver enzymes as well as checks for hepatitis C and hepatitis B antigen.  Follow-up will be in 3 months.   Virtual Visit via Video Note  I connected with Phill Myron on 07/01/20 at 11:30 AM EDT by a video enabled telemedicine application and verified that I am speaking with the correct person using two  identifiers.  Location: Patient: at work alone.  Provider: clinic   I discussed the limitations of evaluation and management by telemedicine and the availability of in person appointments. The patient expressed understanding and agreed to proceed.  History of Present Illness:    Observations/Objective:   Assessment and Plan:   Follow Up Instructions:    I discussed the assessment and treatment plan with the patient. The patient was provided an opportunity to ask questions and all were answered. The patient agreed with the plan and demonstrated an understanding of the instructions.   The patient was advised to call back or seek an in-person evaluation if the symptoms worsen or if the condition fails to improve as anticipated.  I provided 30 minutes of non-face-to-face time during this encounter.   Mliss Sax, MD 30  Mliss Sax, MD

## 2020-07-02 ENCOUNTER — Encounter: Payer: PRIVATE HEALTH INSURANCE | Admitting: Family Medicine

## 2021-06-22 ENCOUNTER — Ambulatory Visit (INDEPENDENT_AMBULATORY_CARE_PROVIDER_SITE_OTHER): Payer: PRIVATE HEALTH INSURANCE | Admitting: Family Medicine

## 2021-06-22 ENCOUNTER — Encounter: Payer: Self-pay | Admitting: Family Medicine

## 2021-06-22 VITALS — BP 118/64 | HR 77 | Temp 97.1°F | Wt 279.0 lb

## 2021-06-22 DIAGNOSIS — Z23 Encounter for immunization: Secondary | ICD-10-CM

## 2021-06-22 DIAGNOSIS — E559 Vitamin D deficiency, unspecified: Secondary | ICD-10-CM | POA: Insufficient documentation

## 2021-06-22 DIAGNOSIS — R7989 Other specified abnormal findings of blood chemistry: Secondary | ICD-10-CM

## 2021-06-22 DIAGNOSIS — R748 Abnormal levels of other serum enzymes: Secondary | ICD-10-CM | POA: Diagnosis not present

## 2021-06-22 DIAGNOSIS — E119 Type 2 diabetes mellitus without complications: Secondary | ICD-10-CM | POA: Diagnosis not present

## 2021-06-22 DIAGNOSIS — E7800 Pure hypercholesterolemia, unspecified: Secondary | ICD-10-CM | POA: Diagnosis not present

## 2021-06-22 DIAGNOSIS — Z1211 Encounter for screening for malignant neoplasm of colon: Secondary | ICD-10-CM | POA: Diagnosis not present

## 2021-06-22 DIAGNOSIS — Z0000 Encounter for general adult medical examination without abnormal findings: Secondary | ICD-10-CM

## 2021-06-22 NOTE — Progress Notes (Addendum)
Established Patient Office Visit  Subjective   Patient ID: Nicholas Howell, male    DOB: 17-Aug-1965  Age: 56 y.o. MRN: [id number]  Chief Complaint  Patient presents with   Annual Exam    Physical Exam     HPI follow-up of type 2 diabetes, elevated liver enzymes and elevated LDL cholesterol.  Lost to follow-up for several months.  Never received diabetes medicines.  Has been doing well otherwise.  Is interested in the Cologuard test.  Would like a Shingrix vaccine today.  Does not believe he ever had chickenpox.  No regular exercise but is quite active at work.  He has appointment scheduled to see a dentist.  He did see eye doc but there was no dilation.  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    Review of Systems  Constitutional: Negative.   HENT: Negative.    Eyes:  Negative for blurred vision, discharge and redness.  Respiratory: Negative.    Cardiovascular: Negative.   Gastrointestinal:  Negative for abdominal pain, blood in stool and melena.  Genitourinary: Negative.  Negative for frequency, hematuria and urgency.  Musculoskeletal: Negative.  Negative for myalgias.  Skin:  Negative for rash.  Neurological:  Negative for tingling, loss of consciousness and weakness.  Endo/Heme/Allergies:  Negative for polydipsia.      Objective:     BP 118/64 (BP Location: Right Arm, Patient Position: Sitting, Cuff Size: Large)   Pulse 77   Temp (!) 97.1 F (36.2 C) (Temporal)   Wt 279 lb (126.6 kg)   SpO2 98%   BMI 36.81 kg/m  BP Readings from Last 3 Encounters:  06/22/21 118/64  06/20/20 118/78  03/18/18 137/87   Wt Readings from Last 3 Encounters:  06/22/21 279 lb (126.6 kg)  07/01/20 279 lb (126.6 kg)  06/20/20 279 lb (126.6 kg)      Physical Exam Constitutional:      General: He is not in acute distress.    Appearance: Normal appearance. He is not ill-appearing, toxic-appearing or diaphoretic.  HENT:     Head: Normocephalic and  atraumatic.     Right Ear: External ear normal.     Left Ear: External ear normal.     Mouth/Throat:     Mouth: Mucous membranes are moist.     Pharynx: Oropharynx is clear. No oropharyngeal exudate or posterior oropharyngeal erythema.  Eyes:     General: No scleral icterus.       Right eye: No discharge.        Left eye: No discharge.     Extraocular Movements: Extraocular movements intact.     Conjunctiva/sclera: Conjunctivae normal.     Pupils: Pupils are equal, round, and reactive to light.  Cardiovascular:     Rate and Rhythm: Normal rate and regular rhythm.  Pulmonary:     Effort: Pulmonary effort is normal. No respiratory distress.     Breath sounds: Normal breath sounds.  Abdominal:     General: Bowel sounds are normal.     Tenderness: There is no abdominal tenderness. There is no guarding.  Musculoskeletal:     Cervical back: No rigidity or tenderness.  Skin:    General: Skin is warm and dry.  Neurological:     Mental Status: He is alert and oriented to person, place, and time.  Psychiatric:        Mood and Affect: Mood normal.        Behavior: Behavior normal.  No results found for any visits on 06/22/21.    The 10-year ASCVD risk score (Arnett DK, et al., 2019) is: 12.2%    Assessment & Plan:   Problem List Items Addressed This Visit       Endocrine   Type 2 diabetes mellitus without complication, without long-term current use of insulin ([facility]) - Primary   Relevant Orders   Basic metabolic panel (Completed)   Hemoglobin A1c (Completed)   Microalbumin / creatinine urine ratio (Completed)   Urinalysis, Routine w reflex microscopic (Completed)   Ambulatory referral to Ophthalmology     Other   Screen for colon cancer   Relevant Orders   Cologuard   Elevated liver enzymes   Relevant Orders   Hepatic function panel (Completed)   Hepatitis B Surface AntiGEN (Completed)   Hepatitis C antibody (Completed)   Need for shingles vaccine   Relevant  Orders   Varicella-zoster vaccine IM (Completed)   Elevated LDL cholesterol level   Relevant Orders   Lipid panel (Completed)   Elevated LFTs   Relevant Orders   US Abdomen Complete    Return in about 3 months (around 09/22/2021), or Will return fasting for above ordered blood work..  Information was given on health maintenance and disease prevention.  He will see a dentist.  Referred for an eye check.  Cologuard information given.  He knows that he will have to contact his insurance company.  If we do need to start diabetes medications receive them.  Although he is quite active on his job recommended 30 minutes of exercise most days of the week.  Libby Maw, MD

## 2021-06-23 ENCOUNTER — Encounter: Payer: PRIVATE HEALTH INSURANCE | Admitting: Family Medicine

## 2021-07-06 ENCOUNTER — Other Ambulatory Visit (INDEPENDENT_AMBULATORY_CARE_PROVIDER_SITE_OTHER): Payer: PRIVATE HEALTH INSURANCE

## 2021-07-06 DIAGNOSIS — R748 Abnormal levels of other serum enzymes: Secondary | ICD-10-CM

## 2021-07-06 DIAGNOSIS — E7800 Pure hypercholesterolemia, unspecified: Secondary | ICD-10-CM

## 2021-07-06 DIAGNOSIS — Z0000 Encounter for general adult medical examination without abnormal findings: Secondary | ICD-10-CM

## 2021-07-06 DIAGNOSIS — E119 Type 2 diabetes mellitus without complications: Secondary | ICD-10-CM | POA: Diagnosis not present

## 2021-07-06 LAB — URINALYSIS, ROUTINE W REFLEX MICROSCOPIC
Bilirubin Urine: NEGATIVE
Hgb urine dipstick: NEGATIVE
Ketones, ur: NEGATIVE
Leukocytes,Ua: NEGATIVE
Nitrite: NEGATIVE
RBC / HPF: NONE SEEN (ref 0–?)
Specific Gravity, Urine: 1.02 (ref 1.000–1.030)
Total Protein, Urine: NEGATIVE
Urine Glucose: NEGATIVE
Urobilinogen, UA: 0.2 (ref 0.0–1.0)
pH: 6.5 (ref 5.0–8.0)

## 2021-07-06 LAB — CBC
HCT: 45.9 % (ref 39.0–52.0)
Hemoglobin: 15.3 g/dL (ref 13.0–17.0)
MCHC: 33.4 g/dL (ref 30.0–36.0)
MCV: 90.8 fl (ref 78.0–100.0)
Platelets: 242 10*3/uL (ref 150.0–400.0)
RBC: 5.06 Mil/uL (ref 4.22–5.81)
RDW: 13.9 % (ref 11.5–15.5)
WBC: 7 10*3/uL (ref 4.0–10.5)

## 2021-07-06 LAB — LIPID PANEL
Cholesterol: 180 mg/dL (ref 0–200)
HDL: 33.6 mg/dL — ABNORMAL LOW (ref >39.00)
LDL Cholesterol: 111 mg/dL — ABNORMAL HIGH (ref 0–99)
NonHDL: 146
Total CHOL/HDL Ratio: 5
Triglycerides: 174 mg/dL — ABNORMAL HIGH (ref 0.0–149.0)
VLDL: 34.8 mg/dL (ref 0.0–40.0)

## 2021-07-06 LAB — BASIC METABOLIC PANEL
BUN: 13 mg/dL (ref 6–23)
CO2: 29 mEq/L (ref 19–32)
Calcium: 10.2 mg/dL (ref 8.4–10.5)
Chloride: 103 mEq/L (ref 96–112)
Creatinine, Ser: 0.85 mg/dL (ref 0.40–1.50)
GFR: 97.12 mL/min (ref >60.00)
Glucose, Bld: 99 mg/dL (ref 70–99)
Potassium: 4.5 mEq/L (ref 3.5–5.1)
Sodium: 139 mEq/L (ref 135–145)

## 2021-07-06 LAB — HEPATIC FUNCTION PANEL
ALT: 67 U/L — ABNORMAL HIGH (ref 0–53)
AST: 68 U/L — ABNORMAL HIGH (ref 0–37)
Albumin: 4.3 g/dL (ref 3.5–5.2)
Alkaline Phosphatase: 51 U/L (ref 39–117)
Bilirubin, Direct: 0.1 mg/dL (ref 0.0–0.3)
Total Bilirubin: 0.4 mg/dL (ref 0.2–1.2)
Total Protein: 7.9 g/dL (ref 6.0–8.3)

## 2021-07-06 LAB — MICROALBUMIN / CREATININE URINE RATIO
Creatinine,U: 175.1 mg/dL
Microalb Creat Ratio: 0.9 mg/g (ref 0.0–30.0)
Microalb, Ur: 1.5 mg/dL (ref 0.0–1.9)

## 2021-07-06 LAB — PSA: PSA: 1.22 ng/mL (ref 0.10–4.00)

## 2021-07-06 LAB — HEMOGLOBIN A1C: Hgb A1c MFr Bld: 6.6 % — ABNORMAL HIGH (ref 4.6–6.5)

## 2021-07-07 LAB — HEPATITIS B SURFACE ANTIGEN: Hepatitis B Surface Ag: NONREACTIVE

## 2021-07-07 LAB — HEPATITIS C ANTIBODY: Hepatitis C Ab: NONREACTIVE

## 2021-07-08 DIAGNOSIS — R7989 Other specified abnormal findings of blood chemistry: Secondary | ICD-10-CM | POA: Insufficient documentation

## 2021-07-08 NOTE — Addendum Note (Signed)
Addended by: Andrez Grime on: 07/08/2021 08:07 AM   Modules accepted: Orders

## 2021-07-13 ENCOUNTER — Telehealth: Payer: Self-pay

## 2021-07-13 DIAGNOSIS — E119 Type 2 diabetes mellitus without complications: Secondary | ICD-10-CM

## 2021-07-13 MED ORDER — METFORMIN HCL ER 500 MG PO TB24: 500.0000 mg | ORAL_TABLET | Freq: Every day | ORAL | 1 refills | Status: DC

## 2021-07-13 NOTE — Telephone Encounter (Signed)
Called patient went over labs and recommendations, per patient he does not have medications for diabetes last Rx sent was June of 2022 okay to sent refill on Metformin XR 500mg ?

## 2021-09-22 ENCOUNTER — Ambulatory Visit: Payer: PRIVATE HEALTH INSURANCE | Admitting: Family Medicine

## 2021-10-09 ENCOUNTER — Ambulatory Visit: Payer: PRIVATE HEALTH INSURANCE | Admitting: Family Medicine

## 2021-10-30 ENCOUNTER — Ambulatory Visit: Payer: PRIVATE HEALTH INSURANCE | Admitting: Family Medicine

## 2021-11-16 ENCOUNTER — Ambulatory Visit: Payer: PRIVATE HEALTH INSURANCE | Admitting: Family Medicine

## 2022-03-26 ENCOUNTER — Ambulatory Visit: Payer: PRIVATE HEALTH INSURANCE | Admitting: Family Medicine

## 2022-04-01 ENCOUNTER — Encounter: Payer: Self-pay | Admitting: Family Medicine

## 2022-04-01 ENCOUNTER — Ambulatory Visit: Payer: PRIVATE HEALTH INSURANCE | Admitting: Family Medicine

## 2022-04-01 VITALS — BP 118/80 | HR 89 | Temp 97.8°F | Ht 73.0 in | Wt 277.8 lb

## 2022-04-01 DIAGNOSIS — R748 Abnormal levels of other serum enzymes: Secondary | ICD-10-CM

## 2022-04-01 DIAGNOSIS — Z9189 Other specified personal risk factors, not elsewhere classified: Secondary | ICD-10-CM | POA: Diagnosis not present

## 2022-04-01 DIAGNOSIS — E119 Type 2 diabetes mellitus without complications: Secondary | ICD-10-CM

## 2022-04-01 DIAGNOSIS — R7989 Other specified abnormal findings of blood chemistry: Secondary | ICD-10-CM | POA: Diagnosis not present

## 2022-04-01 DIAGNOSIS — R7303 Prediabetes: Secondary | ICD-10-CM | POA: Diagnosis not present

## 2022-04-01 DIAGNOSIS — Z23 Encounter for immunization: Secondary | ICD-10-CM

## 2022-04-01 DIAGNOSIS — Z6836 Body mass index (BMI) 36.0-36.9, adult: Secondary | ICD-10-CM

## 2022-04-01 NOTE — Progress Notes (Addendum)
Established Patient Office Visit   Subjective:  Patient ID: Nicholas Howell, male    DOB: 1965-05-19  Age: 57 y.o. 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     HPI Encounter Diagnoses  Name Primary?   Prediabetes Yes   Elevated liver enzymes    Need for shingles vaccine    Elevated LFTs    At risk for apnea    BMI 36.0-36.9,adult    Type 2 diabetes mellitus without complication, without long-term current use of insulin (HCC)    Follow-up of above.  Continues with metformin 500 mg daily for prediabetes.  Due to his BMI and neck circumference, his DOT examiner recommended a sleep study for evaluation of possible apnea.  39 to his wife he does not snore.  No witnessed apneic as episodes.  He does feel rapid rested in the morning.   Review of Systems  Constitutional: Negative.   HENT: Negative.    Eyes:  Negative for blurred vision, discharge and redness.  Respiratory: Negative.    Cardiovascular: Negative.   Gastrointestinal:  Negative for abdominal pain.  Genitourinary: Negative.   Musculoskeletal: Negative.  Negative for myalgias.  Skin:  Negative for rash.  Neurological:  Negative for tingling, loss of consciousness and weakness.  Endo/Heme/Allergies:  Negative for polydipsia.     Current Outpatient Medications:    metFORMIN (GLUCOPHAGE XR) 500 MG 24 hr tablet, Take 1 tablet (500 mg total) by mouth 2 (two) times daily with a meal., Disp: 60 tablet, Rfl: 2   Objective:     BP 118/80 (BP Location: Right Arm, Patient Position: Sitting, Cuff Size: Large)   Pulse 89   Temp 97.8 F (36.6 C) (Temporal)   Ht 6\' 1"  (1.854 m)   Wt 277 lb 12.8 oz (126 kg)   SpO2 95%   BMI 36.65 kg/m  BP Readings from Last 3 Encounters:  04/01/22 118/80  06/22/21 118/64  06/20/20 118/78   Wt Readings from Last 3 Encounters:  04/01/22 277 lb 12.8 oz (126 kg)  06/22/21 279 lb (126.6 kg)  07/01/20 279  lb (126.6 kg)      Physical Exam Constitutional:      General: He is not in acute distress.    Appearance: Normal appearance. He is not ill-appearing, toxic-appearing or diaphoretic.  HENT:     Head: Normocephalic and atraumatic.     Right Ear: External ear normal.     Left Ear: External ear normal.     Mouth/Throat:     Mouth: Mucous membranes are moist.     Pharynx: Oropharynx is clear. No oropharyngeal exudate or posterior oropharyngeal erythema.   Eyes:     General: No scleral icterus.       Right eye: No discharge.        Left eye: No discharge.     Extraocular Movements: Extraocular movements intact.     Conjunctiva/sclera: Conjunctivae normal.     Pupils: Pupils are equal, round, and reactive to light.  Cardiovascular:     Rate and Rhythm: Normal rate and regular rhythm.  Pulmonary:     Effort: Pulmonary effort is normal. No respiratory distress.     Breath sounds: Normal breath sounds.  Abdominal:     General: Bowel sounds are normal.  Musculoskeletal:     Cervical back: No rigidity or tenderness.  Skin:    General: Skin is warm and  dry.  Neurological:     Mental Status: He is alert and oriented to person, place, and time.  Psychiatric:        Mood and Affect: Mood normal.        Behavior: Behavior normal.      Results for orders placed or performed in visit on [id number]  Basic metabolic panel  Result Value Ref Range   Sodium 140 135 - 145 mEq/L   Potassium 4.0 3.5 - 5.1 mEq/L   Chloride 105 96 - 112 mEq/L   CO2 25 19 - 32 mEq/L   Glucose, Bld 89 70 - 99 mg/dL   BUN 11 6 - 23 mg/dL   Creatinine, Ser 0.78 0.40 - 1.50 mg/dL   GFR 99.16 >60.00 mL/min   Calcium 9.4 8.4 - 10.5 mg/dL  Hepatic function panel  Result Value Ref Range   Total Bilirubin 0.4 0.2 - 1.2 mg/dL   Bilirubin, Direct 0.1 0.0 - 0.3 mg/dL   Alkaline Phosphatase 65 39 - 117 U/L   AST 84 (H) 0 - 37 U/L   ALT 74 (H) 0 - 53 U/L   Total Protein 7.4 6.0 - 8.3 g/dL   Albumin 3.9 3.5 - 5.2  g/dL  Hemoglobin A1c  Result Value Ref Range   Hgb A1c MFr Bld 6.6 (H) 4.6 - 6.5 %      The 10-year ASCVD risk score (Arnett DK, et al., 2019) is: 12.7%    Assessment & Plan:   Prediabetes -     Basic metabolic panel -     Hemoglobin A1c  Elevated liver enzymes -     Hepatic function panel  Need for shingles vaccine -     Varicella-zoster vaccine IM  Elevated LFTs -     Hepatic function panel -     US ABDOMEN LIMITED RUQ (LIVER/GB); Future -     Ambulatory referral to Gastroenterology  At risk for apnea -     Ambulatory referral to Pulmonology  BMI 36.0-36.9,adult  Type 2 diabetes mellitus without complication, without long-term current use of insulin (HCC) -     metFORMIN HCl ER; Take 1 tablet (500 mg total) by mouth 2 (two) times daily with a meal.  Dispense: 60 tablet; Refill: 2    Return in about 6 months (around 10/02/2022).  Information given on exercising his weight.  At risk for apnea.  Libby Maw, MD  3/18 addendum: Have increased metformin to twice daily.  Reordered abdominal ultrasound for elevated LFTs.

## 2022-04-02 LAB — BASIC METABOLIC PANEL
BUN: 11 mg/dL (ref 6–23)
CO2: 25 mEq/L (ref 19–32)
Calcium: 9.4 mg/dL (ref 8.4–10.5)
Chloride: 105 mEq/L (ref 96–112)
Creatinine, Ser: 0.78 mg/dL (ref 0.40–1.50)
GFR: 99.16 mL/min (ref >60.00)
Glucose, Bld: 89 mg/dL (ref 70–99)
Potassium: 4 mEq/L (ref 3.5–5.1)
Sodium: 140 mEq/L (ref 135–145)

## 2022-04-02 LAB — HEPATIC FUNCTION PANEL
ALT: 74 U/L — ABNORMAL HIGH (ref 0–53)
AST: 84 U/L — ABNORMAL HIGH (ref 0–37)
Albumin: 3.9 g/dL (ref 3.5–5.2)
Alkaline Phosphatase: 65 U/L (ref 39–117)
Bilirubin, Direct: 0.1 mg/dL (ref 0.0–0.3)
Total Bilirubin: 0.4 mg/dL (ref 0.2–1.2)
Total Protein: 7.4 g/dL (ref 6.0–8.3)

## 2022-04-02 LAB — HEMOGLOBIN A1C: Hgb A1c MFr Bld: 6.6 % — ABNORMAL HIGH (ref 4.6–6.5)

## 2022-04-05 MED ORDER — METFORMIN HCL ER 500 MG PO TB24: 500.0000 mg | ORAL_TABLET | Freq: Two times a day (BID) | ORAL | 2 refills | Status: AC

## 2022-04-05 NOTE — Addendum Note (Signed)
Addended by: Jon Billings on: 04/05/2022 07:54 AM   Modules accepted: Orders

## 2022-04-07 ENCOUNTER — Telehealth: Payer: Self-pay | Admitting: Family Medicine

## 2022-04-07 NOTE — Telephone Encounter (Signed)
Spoke with patient went over results patient verbalized understanding and will start on increased Metformin. Per patient he would prefer to see GI for elevated enzymes instead of another ultrasound. Please advise

## 2022-04-07 NOTE — Telephone Encounter (Signed)
Pt returned your call about his labs. Please try again.

## 2022-04-08 NOTE — Addendum Note (Signed)
Addended by: Jon Billings on: 04/08/2022 10:11 AM   Modules accepted: Orders

## 2022-04-19 ENCOUNTER — Ambulatory Visit (HOSPITAL_BASED_OUTPATIENT_CLINIC_OR_DEPARTMENT_OTHER): Payer: PRIVATE HEALTH INSURANCE

## 2022-04-19 ENCOUNTER — Encounter (HOSPITAL_BASED_OUTPATIENT_CLINIC_OR_DEPARTMENT_OTHER): Payer: Self-pay

## 2022-04-19 ENCOUNTER — Institutional Professional Consult (permissible substitution): Payer: PRIVATE HEALTH INSURANCE | Admitting: Pulmonary Disease

## 2022-04-23 ENCOUNTER — Encounter: Payer: Self-pay | Admitting: Internal Medicine

## 2022-04-23 ENCOUNTER — Ambulatory Visit (INDEPENDENT_AMBULATORY_CARE_PROVIDER_SITE_OTHER): Payer: PRIVATE HEALTH INSURANCE | Admitting: Internal Medicine

## 2022-04-23 VITALS — BP 124/78 | HR 78 | Ht 73.0 in | Wt 277.4 lb

## 2022-04-23 DIAGNOSIS — Z9189 Other specified personal risk factors, not elsewhere classified: Secondary | ICD-10-CM

## 2022-04-23 DIAGNOSIS — G4733 Obstructive sleep apnea (adult) (pediatric): Secondary | ICD-10-CM

## 2022-04-23 NOTE — Assessment & Plan Note (Signed)
Exam suggests risk factors for OSA, although he endorses minimal symptoms. . Appropriate to do sleep study. Plan- schedule home sleep test.  I will forward this office note with form to [employer].. Future test results and CPAP compliance data if appropriate, can be provided when available.

## 2022-04-23 NOTE — Patient Instructions (Signed)
Order- Schedule home sleep test    dx suspect OSA  Please call us about 2 weeks after your sleep test for results and recommendations.

## 2022-04-23 NOTE — Progress Notes (Signed)
04/23/22- [age] yoM never smoker for sleep evaluation requested by DOT examiner with concern of possible OSA due to BMI> 33, Neck circ>/= 17 inches. 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  Wife has told him he does not snore.  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  No history of ENT surgery and he denies heart or lung disease.  He denies family history of sleep apnea.  He reports falling asleep quickly around 8 or 9 PM without sleep medicines.  Wakes at 4:30 AM to start his day.  Only rare occasional coffee and cold weather otherwise no caffeine.  He denies feeling tired enough to pull over in his truck but says he would do so if he needed. We discussed the medical concerns and evaluation of obstructive sleep apnea.  He agrees to proceed with a home sleep test.  Prior to Admission medications   Medication Sig Start Date End Date Taking? Authorizing Provider  metFORMIN (GLUCOPHAGE XR) 500 MG 24 hr tablet Take 1 tablet (500 mg total) by mouth 2 (two) times daily with a meal. 04/05/22  Yes Mliss SaxKremer, William Alfred, MD   Past Medical History:  Diagnosis Date   Arthritis    Past Surgical History:  Procedure Laterality Date   WRIST SURGERY     Family History  Problem Relation Age of Onset   Diabetes Mother    Social History   Socioeconomic History   Marital status: Married    Spouse name: Not on file   Number of children: 2   Years of education: 12   Highest education level: Not on file  Occupational History   Occupation: Truck Hospital doctorDriver  Tobacco Use   Smoking status: Never   Smokeless tobacco: Never  Vaping Use   Vaping Use: Never used  Substance and Sexual Activity   Alcohol use: No   Drug use: No   Sexual activity: Yes  Other Topics Concern   Not on file  Social History Narrative   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    Social Determinants of Health   Financial Resource Strain: Not on file  Food Insecurity: Not on file  Transportation Needs: Not on file  Physical Activity: Not on file  Stress: Not on file  Social Connections: Not on file  Intimate Partner Violence: Not on file   ROS-see HPI + = positive Constitutional:    weight loss, night sweats, fevers, chills, fatigue, lassitude. HEENT:    headaches, difficulty swallowing, tooth/dental problems, sore throat,       sneezing, itching, ear ache, nasal congestion, post nasal drip, snoring CV:    chest pain, orthopnea, PND, swelling in lower extremities, anasarca,                                   dizziness, palpitations Resp:   shortness of breath with exertion or at rest.                productive cough,   non-productive cough, coughing up of blood.              change in color of mucus.  wheezing.   Skin:    rash or lesions. GI:  No-   heartburn, indigestion, abdominal pain, nausea, vomiting, diarrhea,  change in bowel habits, loss of appetite GU: dysuria, change in color of urine, no urgency or frequency.   flank pain. MS:   joint pain, stiffness, decreased range of motion, back pain. Neuro-     nothing unusual Psych:  change in mood or affect.  depression or anxiety.   memory loss.  OBJ- Physical Exam General- Alert, Oriented, Affect-appropriate, Distress- none acute, + obese Skin- rash-none, lesions- none, excoriation- none Lymphadenopathy- none Head- atraumatic            Eyes- Gross vision intact, PERRLA, conjunctivae and secretions clear            Ears- Hearing, canals-normal            Nose- Clear, no-Septal dev, mucus, polyps, erosion, perforation             Throat- Mallampati III-IV , mucosa clear , drainage- none, tonsils- atrophic, +teeth Neck- flexible , trachea midline, no stridor , thyroid nl, carotid no bruit Chest - symmetrical excursion , unlabored           Heart/CV- RRR , no murmur , no gallop  , no  rub, nl s1 s2                           - JVD- none , edema- none, stasis changes- none, varices- none           Lung- clear to P&A, wheeze- none, cough- none , dullness-none, rub- none           Chest wall-  Abd-  Br/ Gen/ Rectal- Not done, not indicated Extrem- cyanosis- none, clubbing, none, atrophy- none, strength- nl Neuro- grossly intact to observation

## 2022-04-23 NOTE — Assessment & Plan Note (Signed)
He is aware of need to exercise. Long-term attention to diet and exercise is advised.

## 2022-05-03 ENCOUNTER — Telehealth: Payer: Self-pay | Admitting: Internal Medicine

## 2022-05-03 NOTE — Telephone Encounter (Signed)
Patient called the office checking to see when he might hear about having sleep study appt scheduled. 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   Routing to [facility] as high priority to see if they might be able to get pt scheduled for the sleep study or if they can see if he possibly was approved to have this done via snap.

## 2022-05-04 NOTE — Telephone Encounter (Signed)
Called and spoke with pt and provided him the phone number for snap. Nothing further needed.

## 2022-05-05 ENCOUNTER — Telehealth: Payer: Self-pay | Admitting: Internal Medicine

## 2022-05-05 NOTE — Telephone Encounter (Signed)
Pt called the office stating that he called [facility] at [phone] about getting the sleep study scheduled.  When pt spoke with someone, they told him that they could not get him scheduled for the sleep study as they  had no records on him for an account that was to be set up so he told them that they could not get him scheduled for the sleep study.  Patient is needing to have the sleep study done by May 23 or he will lose his medical card and will be out of a job.  He is wanting to know if there is any way possible he can just come to our office to get the sleep machine and do the study that way?  PCCs, please advise on this what pt needs to do.

## 2022-05-06 NOTE — Telephone Encounter (Signed)
Working on this & pt aware.

## 2022-05-06 NOTE — Telephone Encounter (Signed)
Pt called in bc he hasn't heard an update about snap receiving his order for his sleep study pt concerned sleep study wont be completed in time, by May 23 rd Please advise.

## 2022-05-06 NOTE — Telephone Encounter (Signed)
Nicholas Howell will you check on this one please

## 2022-05-06 NOTE — Telephone Encounter (Signed)
Spoke to [name] w/ pt's insurance who confirmed pt's policy is active & provided PA phone number is [phone].  Spoke to [place] who advised for [id number]-no pa req.  Ref#  [id number].   I called the patient to ask if he would like to come in tomorrow.  Pt said yes & will call me tomorrow morning to schedule a pick up time. Provided my direct number.

## 2022-05-07 ENCOUNTER — Ambulatory Visit: Payer: PRIVATE HEALTH INSURANCE

## 2022-05-07 DIAGNOSIS — R0683 Snoring: Secondary | ICD-10-CM | POA: Diagnosis not present

## 2022-05-07 DIAGNOSIS — G4733 Obstructive sleep apnea (adult) (pediatric): Secondary | ICD-10-CM

## 2022-05-07 NOTE — Telephone Encounter (Signed)
Sleep study appt scheduled 4/19. Closing encounter.

## 2022-05-12 ENCOUNTER — Telehealth: Payer: Self-pay

## 2022-05-12 DIAGNOSIS — R0683 Snoring: Secondary | ICD-10-CM

## 2022-05-12 NOTE — Telephone Encounter (Signed)
Went over [employer] results with patient. Canceled apts. Advised pt to f/u as needed. NFN

## 2022-05-12 NOTE — Telephone Encounter (Signed)
Home sleep test was within normal limits, showing only occasional apnea events. He doesn't need to return unless he needs our help.

## 2022-05-12 NOTE — Telephone Encounter (Signed)
Dr. Young can you please advise on HST results? 

## 2022-05-14 ENCOUNTER — Telehealth: Payer: Self-pay | Admitting: Family Medicine

## 2022-05-14 ENCOUNTER — Encounter: Payer: Self-pay | Admitting: Internal Medicine

## 2022-05-14 NOTE — Telephone Encounter (Signed)
Dr. Maple Hudson please advise on creation of letter.

## 2022-05-14 NOTE — Telephone Encounter (Signed)
Pt. 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

## 2022-05-14 NOTE — Telephone Encounter (Signed)
ATC LVMTCB X1  

## 2022-05-14 NOTE — Telephone Encounter (Signed)
Pt stated that to get his DOT med exam he needs complete office info attached to his sleep study and for you to fax to [phone]. They need specialty, address, phone #, NPI # and with a signature.

## 2022-05-18 NOTE — Telephone Encounter (Signed)
Called patient for clarity on where fax need to be sent to. No answer LMTCB also sent message via Mychart

## 2022-05-24 NOTE — Telephone Encounter (Signed)
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

## 2022-06-24 ENCOUNTER — Ambulatory Visit: Payer: PRIVATE HEALTH INSURANCE | Admitting: Internal Medicine

## 2022-09-02 ENCOUNTER — Encounter (INDEPENDENT_AMBULATORY_CARE_PROVIDER_SITE_OTHER): Payer: Self-pay

## 2022-10-01 ENCOUNTER — Ambulatory Visit: Payer: PRIVATE HEALTH INSURANCE | Admitting: Family Medicine
